# Patient Record
Sex: Male | Born: 1980 | Race: White | Hispanic: No | Marital: Married | Smoking: Current every day smoker
Health system: Southern US, Community
[De-identification: ages and names within clinical notes are randomized; demographics above are authoritative.]

## PROBLEM LIST (undated history)

## (undated) ENCOUNTER — Ambulatory Visit: Payer: Self-pay | Source: Home / Self Care

## (undated) ENCOUNTER — Emergency Department (HOSPITAL_COMMUNITY): Admission: EM | Payer: 59 | Source: Home / Self Care

## (undated) DIAGNOSIS — J45909 Unspecified asthma, uncomplicated: Secondary | ICD-10-CM

## (undated) HISTORY — DX: Unspecified asthma, uncomplicated: J45.909

---

## 2019-03-09 ENCOUNTER — Other Ambulatory Visit: Payer: Self-pay

## 2019-03-09 ENCOUNTER — Encounter: Payer: Self-pay | Admitting: Family Medicine

## 2019-03-09 ENCOUNTER — Ambulatory Visit (INDEPENDENT_AMBULATORY_CARE_PROVIDER_SITE_OTHER): Payer: Self-pay | Admitting: Family Medicine

## 2019-03-09 VITALS — BP 127/86 | HR 85 | Temp 98.2°F | Ht 69.0 in | Wt 164.0 lb

## 2019-03-09 DIAGNOSIS — R110 Nausea: Secondary | ICD-10-CM

## 2019-03-09 DIAGNOSIS — R197 Diarrhea, unspecified: Secondary | ICD-10-CM

## 2019-03-09 DIAGNOSIS — Z811 Family history of alcohol abuse and dependence: Secondary | ICD-10-CM

## 2019-03-09 DIAGNOSIS — Z808 Family history of malignant neoplasm of other organs or systems: Secondary | ICD-10-CM

## 2019-03-09 DIAGNOSIS — Z833 Family history of diabetes mellitus: Secondary | ICD-10-CM

## 2019-03-09 DIAGNOSIS — R198 Other specified symptoms and signs involving the digestive system and abdomen: Secondary | ICD-10-CM

## 2019-03-09 DIAGNOSIS — Z630 Problems in relationship with spouse or partner: Secondary | ICD-10-CM

## 2019-03-09 DIAGNOSIS — R6882 Decreased libido: Secondary | ICD-10-CM

## 2019-03-09 DIAGNOSIS — R5383 Other fatigue: Secondary | ICD-10-CM

## 2019-03-09 DIAGNOSIS — Z7689 Persons encountering health services in other specified circumstances: Secondary | ICD-10-CM

## 2019-03-09 NOTE — Progress Notes (Signed)
New patient office visit note:  Impression and Recommendations:    1. Nausea   2. Establishing care with new doctor, encounter for   3. Family history of basal cell carcinoma (BCC)   4. Family history of squamous cell carcinoma of skin   5. Family history of diabetes mellitus (DM)   6. Family history of alcoholism   7. Low libido   8. Marital stress   9. Other fatigue   10. Diarrhea, unspecified type   11. Irregular bowel habits     Encounter to Establish Care as New Patient - Extensive discussion held with patient regarding establishing as a new patient.  Discussed policies and practices here at the clinic, and answered all questions about care team and health management during appointment.  - Discussed need for patient to continue to obtain management and screenings with all established specialists.  Educated patient at length about the critical importance of keeping health maintenance up to date.  - Participated in lengthy conversation and all questions were answered.  Low Libido & Sexual Concerns, Marital Stress - Discussed sex therapy with patient today. - Advised patient regarding referral to therapy today.  - Discussed that exercise and weight lifting can increase libido and testosterone.  - Advised patient that "if you don't use it, you lose it." - Encouraged patient to indulge in self love.  - Further advised patient to spend intimate time with his wife regardless of how high his libido is, along the same lines of "fake it til you make it."  - Extensive counseling was provided and all questions were answered.  - Will continue to monitor.  GI Concerns - Nausea, Diarrhea, Irregular Bowel Habits - new onset in April - Referral to gastroenterology provided today. - Will continue to monitor.  - Advised patient that specialist will create and manage patient's treatment plan.  Dad with squamous cell carcinoma, brother with basal cell carcinoma at age 66 -  45 patient to attend dermatology yearly for skin screening. - Referral provided today.  - Discussed importance of regular skin screening.  BMI Counseling - WNL Explained to patient what BMI refers to, and what it means medically.     American Heart Association guidelines for healthy diet, basically Mediterranean diet, and exercise guidelines of 30 minutes 5 days per week or more discussed in detail.  Health counseling performed.  All questions answered.  Lifestyle & Preventative Health Maintenance - Advised patient to continue working toward exercising to improve overall mental, physical, and emotional health.    - Reviewed the "spokes of the wheel" of mood and health management.  Stressed the importance of ongoing prudent habits, including regular exercise, appropriate sleep hygiene, healthful dietary habits, and prayer/meditation to relax.  - Encouraged patient to engage in daily physical activity, especially a formal exercise routine.  Recommended that the patient eventually strive for at least 150 minutes of moderate cardiovascular activity per week according to guidelines established by the [facility].   - Healthy dietary habits encouraged, including low-carb, and high amounts of lean protein in diet.   - Patient should also consume adequate amounts of water.  - Discussed importance of hydration at length with patient today.  - Guidelines and handouts provided.  Recommendations - Need for lab work in near future. - FBW will be obtained as recommended.    Education and routine counseling performed. Handouts provided.    Orders Placed This Encounter  Procedures  . Ambulatory referral to Dermatology  .  Ambulatory referral to Gastroenterology    Gross side effects, risk and benefits, and alternatives of medications discussed with patient.  Patient is aware that all medications have potential side effects and we are unable to predict every side effect or drug-drug interaction  that may occur.  Expresses verbal understanding and consents to current therapy plan and treatment regimen.   Return for Chronic OV w me near future via doxy  with FBW 2-3d prior.   Please see AVS handed out to patient at the end of our visit for further patient instructions/ counseling done pertaining to today's office visit.    Note:  This document was prepared using Dragon voice recognition software and may include unintentional dictation errors.  This document serves as a record of services personally performed by Mellody Dance, DO. It was created on her behalf by Toni Amend, a trained medical scribe. The creation of this record is based on the scribe's personal observations and the provider's statements to them.   I have reviewed the above medical documentation for accuracy and completeness and I concur.  Mellody Dance, DO 03/11/2019 9:56 AM        ---------------------------------------------------------------------------------------------------------------------------------------------------------------------------------------------    Subjective:    Chief complaint:   Chief Complaint  Patient presents with  . Establish Care     HPI: Seth Rios is a pleasant 38 y.o. male who presents to [place] [facility] today to review their medical history with me and establish care.   I asked the patient to review their chronic problem list with me to ensure everything was updated and accurate.    All recent office visits with other providers, any medical records that patient brought in etc  - I reviewed today.     We asked pt to get [place] their medical records from [facility] providers/ specialists that they had seen within the past 3-5 years- if they are in private practice and/or do not work for [employer], [facility], [place], [place] or [employer] owned practice.  Told them to call their specialists to clarify this if they are not sure.    Reason for  establishing care: he has not seen a doctor since high school.  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 Feels that his job can be very stressful. 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 Says "I'm good at it and I know what I'm good at."  Originally from [place].  Got together with his wife and moved to [place] 8 years ago. Moved here because he's in a band.  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 They used to drive to [place] and back to [place].  He quit the band because it was a bad influence on him.   States "the sex and drugs came before the rock 'n roll." Working an 8 hour day, driving 6 hours to play in the band, etc., stopped working. He started the band in 1999 and left in 2013. Says he still practices 3 hours per day but hasn't played live in over a year.  Playing his instrument is meditative for him. He works, then comes home and practices.  His wife has a child turning 16 in 3-4 weeks; child was 8 when he met her. His wife works for herself; has been cleaning houses for years. 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   Tobacco Use Currently smokes. Has been smoking for  about 10 years.  Alcohol Use Had his first beer at age 34. Alcoholism runs on both sides of his family so he stayed away from it. He likes liquor/whisky, but restates he does not drink enough to cause concern.  Drug Use Confirms historical drug use in his band.  Had a Chief Executive Officer with a terrible cocaine problem. States he never did cocaine and never will. Never injected drugs.  Did a lot of marijuana, a lot of ecstasy, some acid, some mushrooms. Quit smoking weed years ago because it made him paranoid and anxious. Denies long-term ecstasy use.  Maybe a year or two of regular use.  Last did mushrooms and ecstasy once in the last year.  Says when he got married, most of his friends gave them drugs  as a wedding present ... and most of them are still in the drawer.  Says "I've got a job and a mortgage and a wife, I don't really have time for that."  Sexual Activity Is monogamous with wife.  Caffeine Use Not much caffeine.  Family Hx He has one older brother and one younger sister.  Alcoholism runs on both sides of his family. Family history of diabetes, high cholesterol, stroke. Paternal uncle had prostate cancer.  Father & brother had skin cancer. Dad had squamous cell carcinoma. Mom has an unknown skin situation. Older brother just had basal cel carcinoma on his nose. Brother with basal cell carcinoma at age 29.  Brother also has cholesterol issues and "waiting on esophageal surgery." Brother has severe acid reflux.  Past Medical Hx Denies ever being diagnosed with blood pressure problems or cholesterol problems.  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  - Low Libido & Marital Stress States he and his wife haven't had sex in six years.  She is seeing a therapist for her own reasons, and patient is here today to try to figure out how to fix his libido.  "I've never really had much of a libido."  He's always been this way.  Notes "if I can't resolve this, it's the end of my marriage; that's where we're at now."  Confirms that as things are, his wife feels like he doesn't love or desire her.  "It's been a long conversation and argument between the two of [place].  Most of the time it doesn't go very well when it does come up."  He is looking for a therapist for further assistance as well.  He's worried he may have low testosterone due to his lack of libido.  In general, states he does feel fatigued and tired.  Feels that this has increased in the last year more than normal.  Denies excess urination or excess thirst.  Denies concerns getting an erection, just is experiencing lack of desire.  - GI Concerns - Loose Stools Has been logging his  dietary intake for the last several weeks.  Notes he realized he doesn't drink a lot of water "because I don't like water."    The other reason he's here is because he's probably had twelve solid "regular" bowel movements since March.  Says he's never had GI distress in his life.  In April, he realized he was having issues.  Notes his bowels are always loose, and passes bowels once maybe twice per day.  Says he eats salad constantly and his stool is still loose.  It has never been this way in the past.  Denies unusual gurgling abdominal pain,  cramping, blood in the stool.  Didn't have nausea until about a month ago.  Started to feel nauseous throughout the day.  Has gurgling when hungry, but "nothing crazy."  He did try imodium but it backed him up instead of providing relief.    "I can drink one [place] beer and I'm fine the next day."  Says his GI is fine for a day afterward.  Denies family history of IBS, Crohn's Disease, GI cancer, pancreatic cancer, stomach cancer.  Confirms he has been having diarrhea, irregular bowel habits, with more recent nausea. Denies abdominal pain.  Says "for the longest time, it was just loose."  Has weighed 165 lbs for years.  Not losing weight.    Wt Readings from Last 3 Encounters:  03/09/19 164 lb (74.4 kg)   BP Readings from Last 3 Encounters:  03/09/19 127/86   Pulse Readings from Last 3 Encounters:  03/09/19 85   BMI Readings from Last 3 Encounters:  03/09/19 24.22 kg/m    Patient Care Team    Relationship Specialty Notifications Start End  Mellody Dance, DO PCP - General Family Medicine  03/09/19     There are no active problems to display for this patient.      As reported by pt:  History reviewed. No pertinent past medical history.   History reviewed. No pertinent surgical history.   Family History  Problem Relation Age of Onset  . Skin cancer Father   . Diabetes Father   . Skin cancer Brother   . Prostate cancer Paternal  Uncle   . Alcohol abuse Maternal Grandmother   . Stroke Maternal Grandfather   . Alcohol abuse Maternal Grandfather   . High Cholesterol Other   . Hypertension Other      Social History   Substance and Sexual Activity  Drug Use Not on file     Social History   Substance and Sexual Activity  Alcohol Use Yes  . Alcohol/week: 1.0 standard drinks  . Types: 1 Standard drinks or equivalent per week     Social History   Tobacco Use  Smoking Status Current Every Day Smoker  . Packs/day: 1.00  . Years: 10.00  . Pack years: 10.00  . Types: Cigarettes  Smokeless Tobacco Never Used     No outpatient medications have been marked as taking for the 03/09/19 encounter (Office Visit) with Mellody Dance, DO.    Allergies: Patient has no known allergies.   Review of Systems  Constitutional: Negative for chills, diaphoresis, fever, malaise/fatigue and weight loss.  HENT: Negative for congestion, sore throat and tinnitus.   Eyes: Negative for blurred vision, double vision and photophobia.  Respiratory: Negative for cough and wheezing.   Cardiovascular: Negative for chest pain and palpitations.  Gastrointestinal: Positive for diarrhea and nausea. Negative for blood in stool and vomiting.  Genitourinary: Negative for dysuria, frequency and urgency.  Musculoskeletal: Negative for joint pain and myalgias.  Skin: Negative for itching and rash.  Neurological: Negative for dizziness, focal weakness, weakness and headaches.  Endo/Heme/Allergies: Negative for environmental allergies and polydipsia. Does not bruise/bleed easily.  Psychiatric/Behavioral: Negative for depression and memory loss. The patient is not nervous/anxious and does not have insomnia.         Objective:   Blood pressure 127/86, pulse 85, temperature 98.2 F (36.8 C), height '5\' 9"'$  (1.753 m), weight 164 lb (74.4 kg), SpO2 97 %. Body mass index is 24.22 kg/m. General: Well Developed, well nourished, and in no  acute  distress.  Neuro: Alert and oriented x3, extra-ocular muscles intact, sensation grossly intact.  HEENT:[place]/AT, PERRLA, neck supple, No carotid bruits Skin: no gross rashes  Cardiac: Regular rate and rhythm Respiratory: Essentially clear to auscultation bilaterally. Not using accessory muscles, speaking in full sentences.  Abdominal: not grossly distended Musculoskeletal: Ambulates w/o diff, FROM * 4 ext.  Vasc: less 2 sec cap RF, warm and pink  Psych:  No HI/SI, judgement and insight good, Euthymic mood. Full Affect.    Recent Results (from the past 2160 hour(s))  T3     Status: None   Collection Time: 03/24/19  9:28 AM  Result Value Ref Range   T3, Total 166 71 - 180 ng/dL  T4, free     Status: None   Collection Time: 03/24/19  9:28 AM  Result Value Ref Range   Free T4 1.56 0.82 - 1.77 ng/dL  VITAMIN D 25 Hydroxy (Vit-D Deficiency, Fractures)     Status: None   Collection Time: 03/24/19  9:28 AM  Result Value Ref Range   Vit D, 25-Hydroxy 36.2 30.0 - 100.0 ng/mL    Comment: Vitamin D deficiency has been defined by the [place] and an Endocrine Society practice guideline as a level of serum 25-OH vitamin D less than 20 ng/mL (1,2). The Endocrine Society went on to further define vitamin D insufficiency as a level between 21 and 29 ng/mL (2). 1. IOM (Institute of Medicine). 2010. Dietary reference    intakes for calcium and D. [place]: The    [employer]. 2. Holick MF, Binkley [place], Bischoff-Ferrari HA, et al.    Evaluation, treatment, and prevention of vitamin D    deficiency: an Endocrine Society clinical practice    guideline. JCEM. 2011 Jul; 96(7):1911-30.   TSH     Status: None   Collection Time: 03/24/19  9:28 AM  Result Value Ref Range   TSH 1.270 0.450 - 4.500 uIU/mL  Lipid panel     Status: Abnormal   Collection Time: 03/24/19  9:28 AM  Result Value Ref Range   Cholesterol, Total 200 (H) 100 - 199 mg/dL   Triglycerides 124 0 -  149 mg/dL   HDL 38 (L) >39 mg/dL   VLDL Cholesterol Cal 25 5 - 40 mg/dL   LDL Calculated 137 (H) 0 - 99 mg/dL   Chol/HDL Ratio 5.3 (H) 0.0 - 5.0 ratio    Comment:                                   T. Chol/HDL Ratio                                             Men  Women                               1/2 Avg.Risk  3.4    3.3                                   Avg.Risk  5.0    4.4  2X Avg.Risk  9.6    7.1                                3X Avg.Risk 23.4   11.0   Hemoglobin A1c     Status: Abnormal   Collection Time: 03/24/19  9:28 AM  Result Value Ref Range   Hgb A1c MFr Bld 5.7 (H) 4.8 - 5.6 %    Comment:          Prediabetes: 5.7 - 6.4          Diabetes: >6.4          Glycemic control for adults with diabetes: <7.0    Est. average glucose Bld gHb Est-mCnc 117 mg/dL  Comprehensive metabolic panel     Status: Abnormal   Collection Time: 03/24/19  9:28 AM  Result Value Ref Range   Glucose 99 65 - 99 mg/dL   BUN 12 6 - 20 mg/dL   Creatinine, Ser 0.94 0.76 - 1.27 mg/dL   GFR calc non Af Amer 102 >59 mL/min/1.73   GFR calc Af Amer 118 >59 mL/min/1.73   BUN/Creatinine Ratio 13 9 - 20   Sodium 142 134 - 144 mmol/L   Potassium 5.1 3.5 - 5.2 mmol/L   Chloride 103 96 - 106 mmol/L   CO2 23 20 - 29 mmol/L   Calcium 9.9 8.7 - 10.2 mg/dL   Total Protein 6.8 6.0 - 8.5 g/dL   Albumin 4.8 4.0 - 5.0 g/dL   Globulin, Total 2.0 1.5 - 4.5 g/dL   Albumin/Globulin Ratio 2.4 (H) 1.2 - 2.2   Bilirubin Total 0.6 0.0 - 1.2 mg/dL   Alkaline Phosphatase 108 39 - 117 IU/L   AST 16 0 - 40 IU/L   ALT 16 0 - 44 IU/L  CBC with Differential/Platelet     Status: Abnormal   Collection Time: 03/24/19  9:28 AM  Result Value Ref Range   WBC 7.5 3.4 - 10.8 x10E3/uL   RBC 5.13 4.14 - 5.80 x10E6/uL   Hemoglobin 17.0 13.0 - 17.7 g/dL   Hematocrit 48.4 37.5 - 51.0 %   MCV 94 79 - 97 fL   MCH 33.1 (H) 26.6 - 33.0 pg   MCHC 35.1 31.5 - 35.7 g/dL   RDW 11.9 11.6 - 15.4 %   Platelets  217 150 - 450 x10E3/uL   Neutrophils 67 Not Estab. %   Lymphs 23 Not Estab. %   Monocytes 8 Not Estab. %   Eos 1 Not Estab. %   Basos 1 Not Estab. %   Neutrophils Absolute 5.1 1.4 - 7.0 x10E3/uL   Lymphocytes Absolute 1.7 0.7 - 3.1 x10E3/uL   Monocytes Absolute 0.6 0.1 - 0.9 x10E3/uL   EOS (ABSOLUTE) 0.1 0.0 - 0.4 x10E3/uL   Basophils Absolute 0.0 0.0 - 0.2 x10E3/uL   Immature Granulocytes 0 Not Estab. %   Immature Grans (Abs) 0.0 0.0 - 0.1 x10E3/uL

## 2019-03-09 NOTE — Patient Instructions (Signed)
   Please realize, EXERCISE IS MEDICINE!  -  American Heart Association ( AHA) guidelines for exercise : If you are in good health, without any medical conditions, you should engage in 150-300 minutes of moderate intensity aerobic activity per week.  This means you should be huffing and puffing throughout your workout.   Engaging in regular exercise will improve brain function and memory, as well as improve mood, boost immune system and help with weight management.  As well as the other, more well-known effects of exercise such as decreasing blood sugar levels, decreasing blood pressure,  and decreasing bad cholesterol levels/ increasing good cholesterol levels.     -  The AHA strongly endorses consumption of a diet that contains a variety of foods from all the food categories with an emphasis on fruits and vegetables; fat-free and low-fat dairy products; cereal and grain products; legumes and nuts; and fish, poultry, and/or extra lean meats.    Excessive food intake, especially of foods high in saturated and trans fats, sugar, and salt, should be avoided.    Adequate water intake of roughly 1/2 of your weight in pounds, should equal the ounces of water per day you should drink.  So for instance, if you're 200 pounds, that would be 100 ounces of water per day.         Mediterranean Diet  Why follow it? Research shows. . Those who follow the Mediterranean diet have a reduced risk of heart disease  . The diet is associated with a reduced incidence of Parkinson's and Alzheimer's diseases . People following the diet may have longer life expectancies and lower rates of chronic diseases  . The Dietary Guidelines for Americans recommends the Mediterranean diet as an eating plan to promote health and prevent disease  What Is the Mediterranean Diet?  . Healthy eating plan based on typical foods and recipes of Mediterranean-style cooking . The diet is primarily a plant based diet; these foods should  make up a majority of meals   Starches - Plant based foods should make up a majority of meals - They are an important sources of vitamins, minerals, energy, antioxidants, and fiber - Choose whole grains, foods high in fiber and minimally processed items  - Typical grain sources include wheat, oats, barley, corn, brown rice, bulgar, farro, millet, polenta, couscous  - Various types of beans include chickpeas, lentils, fava beans, black beans, white beans   Fruits  Veggies - Large quantities of antioxidant rich fruits & veggies; 6 or more servings  - Vegetables can be eaten raw or lightly drizzled with oil and cooked  - Vegetables common to the traditional Mediterranean Diet include: artichokes, arugula, beets, broccoli, brussel sprouts, cabbage, carrots, celery, collard greens, cucumbers, eggplant, kale, leeks, lemons, lettuce, mushrooms, okra, onions, peas, peppers, potatoes, pumpkin, radishes, rutabaga, shallots, spinach, sweet potatoes, turnips, zucchini - Fruits common to the Mediterranean Diet include: apples, apricots, avocados, cherries, clementines, dates, figs, grapefruits, grapes, melons, nectarines, oranges, peaches, pears, pomegranates, strawberries, tangerines  Fats - Replace butter and margarine with healthy oils, such as olive oil, canola oil, and tahini  - Limit nuts to no more than a handful a day  - Nuts include walnuts, almonds, pecans, pistachios, pine nuts  - Limit or avoid candied, honey roasted or heavily salted nuts - Olives are central to the Mediterranean diet - can be eaten whole or used in a variety of dishes   Meats Protein - Limiting red meat: no more than a few   times a month - When eating red meat: choose lean cuts and keep the portion to the size of deck of cards - Eggs: approx. 0 to 4 times a week  - Fish and lean poultry: at least 2 a week  - Healthy protein sources include, chicken, [place], lean beef, lamb - Increase intake of seafood such as tuna, salmon,  trout, mackerel, shrimp, scallops - Avoid or limit high fat processed meats such as sausage and bacon  Dairy - Include moderate amounts of low fat dairy products  - Focus on healthy dairy such as fat free yogurt, skim milk, low or reduced fat cheese - Limit dairy products higher in fat such as whole or 2% milk, cheese, ice cream  Alcohol - Moderate amounts of red wine is ok  - No more than 5 oz daily for women (all ages) and men older than age 40  - No more than 10 oz of wine daily for men younger than 3265  Other - Limit sweets and other desserts  - Use herbs and spices instead of salt to flavor foods  - Herbs and spices common to the traditional Mediterranean Diet include: basil, bay leaves, chives, cloves, cumin, fennel, garlic, lavender, marjoram, mint, oregano, parsley, pepper, rosemary, sage, savory, sumac, tarragon, thyme   It's not just a diet, it's a lifestyle:  . The Mediterranean diet includes lifestyle factors typical of those in the region  . Foods, drinks and meals are best eaten with others and savored . Daily physical activity is important for overall good health . This could be strenuous exercise like running and aerobics . This could also be more leisurely activities such as walking, housework, yard-work, or taking the stairs . Moderation is the key; a balanced and healthy diet accommodates most foods and drinks . Consider portion sizes and frequency of consumption of certain foods   Meal Ideas & Options:  . Breakfast:  o Whole wheat toast or whole wheat English muffins with peanut butter & hard boiled egg o Steel cut oats topped with apples & cinnamon and skim milk  o Fresh fruit: banana, strawberries, melon, berries, peaches  o Smoothies: strawberries, bananas, greek yogurt, peanut butter o Low fat greek yogurt with blueberries and granola  o Egg white omelet with spinach and mushrooms o Breakfast couscous: whole wheat couscous, apricots, skim milk, cranberries  .  Sandwiches:  o Hummus and grilled vegetables (peppers, zucchini, squash) on whole wheat bread   o Grilled chicken on whole wheat pita with lettuce, tomatoes, cucumbers or tzatziki  o Tuna salad on whole wheat bread: tuna salad made with greek yogurt, olives, red peppers, capers, green onions o Garlic rosemary lamb pita: lamb sauted with garlic, rosemary, salt & pepper; add lettuce, cucumber, greek yogurt to pita - flavor with lemon juice and black pepper  . Seafood:  o Mediterranean grilled salmon, seasoned with garlic, basil, parsley, lemon juice and black pepper o Shrimp, lemon, and spinach whole-grain pasta salad made with low fat greek yogurt  o Seared scallops with lemon orzo  o Seared tuna steaks seasoned salt, pepper, coriander topped with tomato mixture of olives, tomatoes, olive oil, minced garlic, parsley, green onions and cappers  . Meats:  o Herbed greek chicken salad with kalamata olives, cucumber, feta  o Red bell peppers stuffed with spinach, bulgur, lean ground beef (or lentils) & topped with feta   o Kebabs: skewers of chicken, tomatoes, onions, zucchini, squash  o [place] burgers: made with red onions, mint,  dill, lemon juice, feta cheese topped with roasted red peppers . Vegetarian o Cucumber salad: cucumbers, artichoke hearts, celery, red onion, feta cheese, tossed in olive oil & lemon juice  o Hummus and whole grain pita points with a greek salad (lettuce, tomato, feta, olives, cucumbers, red onion) o Lentil soup with celery, carrots made with vegetable broth, garlic, salt and pepper  o Tabouli salad: parsley, bulgur, mint, scallions, cucumbers, tomato, radishes, lemon juice, olive oil, salt and pepper.      Diarrhea, Adult Diarrhea is frequent loose and watery bowel movements. Diarrhea can make you feel weak and cause you to become dehydrated. Dehydration can make you tired and thirsty, cause you to have a dry mouth, and decrease how often you urinate. Diarrhea  typically lasts 2-3 days. However, it can last longer if it is a sign of something more serious. It is important to treat your diarrhea as told by your health care provider. Follow these instructions at home: Eating and drinking     Follow these recommendations as told by your health care provider:  Take an oral rehydration solution (ORS). This is an over-the-counter medicine that helps return your body to its normal balance of nutrients and water. It is found at pharmacies and retail stores.  Drink plenty of fluids, such as water, ice chips, diluted fruit juice, and low-calorie sports drinks. You can drink milk also, if desired.  Avoid drinking fluids that contain a lot of sugar or caffeine, such as energy drinks, sports drinks, and soda.  Eat bland, easy-to-digest foods in small amounts as you are able. These foods include bananas, applesauce, rice, lean meats, toast, and crackers.  Avoid alcohol.  Avoid spicy or fatty foods.  Medicines  Take over-the-counter and prescription medicines only as told by your health care provider.  If you were prescribed an antibiotic medicine, take it as told by your health care provider. Do not stop using the antibiotic even if you start to feel better. General instructions   Wash your hands often using soap and water. If soap and water are not available, use a hand sanitizer. Others in the household should wash their hands as well. Hands should be washed: ? After using the toilet or changing a diaper. ? Before preparing, cooking, or serving food. ? While caring for a sick person or while visiting someone in a hospital.  Drink enough fluid to keep your urine pale yellow.  Rest at home while you recover.  Watch your condition for any changes.  Take a warm bath to relieve any burning or pain from frequent diarrhea episodes.  Keep all follow-up visits as told by your health care provider. This is important. Contact a health care provider if:   You have a fever.  Your diarrhea gets worse.  You have new symptoms.  You cannot keep fluids down.  You feel light-headed or dizzy.  You have a headache.  You have muscle cramps. Get help right away if:  You have chest pain.  You feel extremely weak or you faint.  You have bloody or black stools or stools that look like tar.  You have severe pain, cramping, or bloating in your abdomen.  You have trouble breathing or you are breathing very quickly.  Your heart is beating very quickly.  Your skin feels cold and clammy.  You feel confused.  You have signs of dehydration, such as: ? Dark urine, very little urine, or no urine. ? Cracked lips. ? Dry mouth. ? Sunken  eyes. ? Sleepiness. ? Weakness. Summary  Diarrhea is frequent loose and watery bowel movements. Diarrhea can make you feel weak and cause you to become dehydrated.  Drink enough fluids to keep your urine pale yellow.  Make sure that you wash your hands after using the toilet. If soap and water are not available, use hand sanitizer.  Contact a health care provider if your diarrhea gets worse or you have new symptoms.  Get help right away if you have signs of dehydration. This information is not intended to replace advice given to you by your health care provider. Make sure you discuss any questions you have with your health care provider. Document Released: 07/13/2002 Document Revised: 12/27/2017 Document Reviewed: 12/27/2017 [employer] Patient Education  2020 [employer]

## 2019-03-11 ENCOUNTER — Encounter: Payer: Self-pay | Admitting: Gastroenterology

## 2019-03-23 ENCOUNTER — Other Ambulatory Visit: Payer: Self-pay

## 2019-03-23 DIAGNOSIS — Z0000 Encounter for general adult medical examination without abnormal findings: Secondary | ICD-10-CM

## 2019-03-23 DIAGNOSIS — R5383 Other fatigue: Secondary | ICD-10-CM

## 2019-03-23 DIAGNOSIS — Z833 Family history of diabetes mellitus: Secondary | ICD-10-CM

## 2019-03-24 ENCOUNTER — Other Ambulatory Visit: Payer: Self-pay

## 2019-03-24 DIAGNOSIS — Z0000 Encounter for general adult medical examination without abnormal findings: Secondary | ICD-10-CM

## 2019-03-24 DIAGNOSIS — R5383 Other fatigue: Secondary | ICD-10-CM

## 2019-03-24 DIAGNOSIS — Z833 Family history of diabetes mellitus: Secondary | ICD-10-CM

## 2019-03-25 LAB — COMPREHENSIVE METABOLIC PANEL
ALT: 16 IU/L (ref 0–44)
AST: 16 IU/L (ref 0–40)
Albumin/Globulin Ratio: 2.4 — ABNORMAL HIGH (ref 1.2–2.2)
Albumin: 4.8 g/dL (ref 4.0–5.0)
Alkaline Phosphatase: 108 IU/L (ref 39–117)
BUN/Creatinine Ratio: 13 (ref 9–20)
BUN: 12 mg/dL (ref 6–20)
Bilirubin Total: 0.6 mg/dL (ref 0.0–1.2)
CO2: 23 mmol/L (ref 20–29)
Calcium: 9.9 mg/dL (ref 8.7–10.2)
Chloride: 103 mmol/L (ref 96–106)
Creatinine, Ser: 0.94 mg/dL (ref 0.76–1.27)
GFR calc Af Amer: 118 mL/min/{1.73_m2} (ref >59)
GFR calc non Af Amer: 102 mL/min/{1.73_m2} (ref >59)
Globulin, Total: 2 g/dL (ref 1.5–4.5)
Glucose: 99 mg/dL (ref 65–99)
Potassium: 5.1 mmol/L (ref 3.5–5.2)
Sodium: 142 mmol/L (ref 134–144)
Total Protein: 6.8 g/dL (ref 6.0–8.5)

## 2019-03-25 LAB — CBC WITH DIFFERENTIAL/PLATELET
Basophils Absolute: 0 10*3/uL (ref 0.0–0.2)
Basos: 1 %
EOS (ABSOLUTE): 0.1 10*3/uL (ref 0.0–0.4)
Eos: 1 %
Hematocrit: 48.4 % (ref 37.5–51.0)
Hemoglobin: 17 g/dL (ref 13.0–17.7)
Immature Grans (Abs): 0 10*3/uL (ref 0.0–0.1)
Immature Granulocytes: 0 %
Lymphocytes Absolute: 1.7 10*3/uL (ref 0.7–3.1)
Lymphs: 23 %
MCH: 33.1 pg — ABNORMAL HIGH (ref 26.6–33.0)
MCHC: 35.1 g/dL (ref 31.5–35.7)
MCV: 94 fL (ref 79–97)
Monocytes Absolute: 0.6 10*3/uL (ref 0.1–0.9)
Monocytes: 8 %
Neutrophils Absolute: 5.1 10*3/uL (ref 1.4–7.0)
Neutrophils: 67 %
Platelets: 217 10*3/uL (ref 150–450)
RBC: 5.13 x10E6/uL (ref 4.14–5.80)
RDW: 11.9 % (ref 11.6–15.4)
WBC: 7.5 10*3/uL (ref 3.4–10.8)

## 2019-03-25 LAB — T3: T3, Total: 166 ng/dL (ref 71–180)

## 2019-03-25 LAB — LIPID PANEL
Chol/HDL Ratio: 5.3 ratio — ABNORMAL HIGH (ref 0.0–5.0)
Cholesterol, Total: 200 mg/dL — ABNORMAL HIGH (ref 100–199)
HDL: 38 mg/dL — ABNORMAL LOW (ref >39)
LDL Calculated: 137 mg/dL — ABNORMAL HIGH (ref 0–99)
Triglycerides: 124 mg/dL (ref 0–149)
VLDL Cholesterol Cal: 25 mg/dL (ref 5–40)

## 2019-03-25 LAB — HEMOGLOBIN A1C
Est. average glucose Bld gHb Est-mCnc: 117 mg/dL
Hgb A1c MFr Bld: 5.7 % — ABNORMAL HIGH (ref 4.8–5.6)

## 2019-03-25 LAB — T4, FREE: Free T4: 1.56 ng/dL (ref 0.82–1.77)

## 2019-03-25 LAB — VITAMIN D 25 HYDROXY (VIT D DEFICIENCY, FRACTURES): Vit D, 25-Hydroxy: 36.2 ng/mL (ref 30.0–100.0)

## 2019-03-25 LAB — TSH: TSH: 1.27 u[IU]/mL (ref 0.450–4.500)

## 2019-03-26 ENCOUNTER — Encounter (INDEPENDENT_AMBULATORY_CARE_PROVIDER_SITE_OTHER): Payer: Self-pay | Admitting: Family Medicine

## 2019-03-26 ENCOUNTER — Other Ambulatory Visit: Payer: Self-pay

## 2019-03-26 ENCOUNTER — Encounter: Payer: Self-pay | Admitting: Family Medicine

## 2019-04-01 ENCOUNTER — Other Ambulatory Visit: Payer: Self-pay

## 2019-04-01 ENCOUNTER — Ambulatory Visit (INDEPENDENT_AMBULATORY_CARE_PROVIDER_SITE_OTHER): Payer: Self-pay | Admitting: Family Medicine

## 2019-04-01 ENCOUNTER — Encounter: Payer: Self-pay | Admitting: Family Medicine

## 2019-04-01 VITALS — Ht 69.0 in | Wt 158.4 lb

## 2019-04-01 DIAGNOSIS — R7302 Impaired glucose tolerance (oral): Secondary | ICD-10-CM

## 2019-04-01 DIAGNOSIS — Z630 Problems in relationship with spouse or partner: Secondary | ICD-10-CM | POA: Insufficient documentation

## 2019-04-01 DIAGNOSIS — E559 Vitamin D deficiency, unspecified: Secondary | ICD-10-CM

## 2019-04-01 DIAGNOSIS — R5383 Other fatigue: Secondary | ICD-10-CM | POA: Insufficient documentation

## 2019-04-01 DIAGNOSIS — R251 Tremor, unspecified: Secondary | ICD-10-CM

## 2019-04-01 DIAGNOSIS — E786 Lipoprotein deficiency: Secondary | ICD-10-CM

## 2019-04-01 DIAGNOSIS — E7841 Elevated Lipoprotein(a): Secondary | ICD-10-CM

## 2019-04-01 DIAGNOSIS — R6882 Decreased libido: Secondary | ICD-10-CM | POA: Insufficient documentation

## 2019-04-01 MED ORDER — VITAMIN D3 125 MCG (5000 UT) PO TABS: ORAL_TABLET | ORAL | 3 refills | Status: DC

## 2019-04-01 NOTE — Patient Instructions (Addendum)
Risk factors for prediabetes and type 2 diabetes  Researchers don't fully understand why some people develop prediabetes and type 2 diabetes and others don't.  It's clear that certain factors increase the risk, however, including:  Weight. The more fatty tissue you have, the more resistant your cells become to insulin.  Inactivity. The less active you are, the greater your risk. Physical activity helps you control your weight, uses up glucose as energy and makes your cells more sensitive to insulin.  Family history. Your risk increases if a parent or sibling has type 2 diabetes.  Race. Although it's unclear why, people of certain races - including blacks, Hispanics, American Indians and Asian-Americans - are at higher risk.  Age. Your risk increases as you get older. This may be because you tend to exercise less, lose muscle mass and gain weight as you age. But type 2 diabetes is also increasing dramatically among children, adolescents and younger adults.  Gestational diabetes. If you developed gestational diabetes when you were pregnant, your risk of developing prediabetes and type 2 diabetes later increases. If you gave birth to a baby weighing more than 9 pounds (4 kilograms), you're also at risk of type 2 diabetes.  Polycystic ovary syndrome. For women, having polycystic ovary syndrome - a common condition characterized by irregular menstrual periods, excess hair growth and obesity - increases the risk of diabetes.  High blood pressure. Having blood pressure over 140/90 millimeters of mercury (mm Hg) is linked to an increased risk of type 2 diabetes.  Abnormal cholesterol and triglyceride levels. If you have low levels of high-density lipoprotein (HDL), or "good," cholesterol, your risk of type 2 diabetes is higher. Triglycerides are another type of fat carried in the blood. People with high levels of triglycerides have an increased risk of type 2 diabetes. Your doctor can let you know what your  cholesterol and triglyceride levels are.  A good guide to good carbs: The glycemic index ---If you have diabetes, or at risk for diabetes, you know all too well that when you eat carbohydrates, your blood sugar goes up. The total amount of carbs you consume at a meal or in a snack mostly determines what your blood sugar will do. But the food itself also plays a role. A serving of white rice has almost the same effect as eating pure table sugar - a quick, high spike in blood sugar. A serving of lentils has a slower, smaller effect.  ---Picking good sources of carbs can help you control your blood sugar and your weight. Even if you don't have diabetes, eating healthier carbohydrate-rich foods can help ward off a host of chronic conditions, from heart disease to various cancers to, well, diabetes.  ---One way to choose foods is with the glycemic index (GI). This tool measures how much a food boosts blood sugar.  The glycemic index rates the effect of a specific amount of a food on blood sugar compared with the same amount of pure glucose. A food with a glycemic index of 28 boosts blood sugar only 28% as much as pure glucose. One with a GI of 95 acts like pure glucose.    High glycemic foods result in a quick spike in insulin and blood sugar (also known as blood glucose).  Low glycemic foods have a slower, smaller effect- these are healthier for you.   Using the glycemic index Using the glycemic index is easy: choose foods in the low GI category instead of those in the high  GI category (see below), and go easy on those in between. Low glycemic index (GI of 55 or less): Most fruits and vegetables, beans, minimally processed grains, pasta, low-fat dairy foods, and nuts.  Moderate glycemic index (GI 56 to 69): White and sweet potatoes, corn, white rice, couscous, breakfast cereals such as Cream of Wheat and Mini Wheats.  High glycemic index (GI of 70 or higher): White bread, rice cakes, most crackers,  bagels, cakes, doughnuts, croissants, most packaged breakfast cereals. You can see the values for 100 commons foods and get links to more at www.[email or web address].si.  Swaps for lowering glycemic index  Instead of this high-glycemic index food Eat this lower-glycemic index food  White rice Brown rice or converted rice  Instant oatmeal Steel-cut oats  Cornflakes Bran flakes  Baked potato Pasta, bulgur  White bread Whole-grain bread  Corn Peas or leafy greens       Prediabetes Eating Plan  Prediabetes--also called impaired glucose tolerance or impaired fasting glucose--is a condition that causes blood sugar (blood glucose) levels to be higher than normal. Following a healthy diet can help to keep prediabetes under control. It can also help to lower the risk of type 2 diabetes and heart disease, which are increased in people who have prediabetes. Along with regular exercise, a healthy diet:  Promotes weight loss.  Helps to control blood sugar levels.  Helps to improve the way that the body uses insulin.   WHAT DO I NEED TO KNOW ABOUT THIS EATING PLAN?   Use the glycemic index (GI) to plan your meals. The index tells you how quickly a food will raise your blood sugar. Choose low-GI foods. These foods take a longer time to raise blood sugar.  Pay close attention to the amount of carbohydrates in the food that you eat. Carbohydrates increase blood sugar levels.  Keep track of how many calories you take in. Eating the right amount of calories will help you to achieve a healthy weight. Losing about 7 percent of your starting weight can help to prevent type 2 diabetes.  You may want to follow a Mediterranean diet. This diet includes a lot of vegetables, lean meats or fish, whole grains, fruits, and healthy oils and fats.   WHAT FOODS CAN I EAT?  Grains Whole grains, such as whole-wheat or whole-grain breads, crackers, cereals, and pasta. Unsweetened oatmeal. Bulgur. Barley.  Quinoa. Brown rice. Corn or whole-wheat flour tortillas or taco shells. Vegetables Lettuce. Spinach. Peas. Beets. Cauliflower. Cabbage. Broccoli. Carrots. Tomatoes. Squash. Eggplant. Herbs. Peppers. Onions. Cucumbers. Brussels sprouts. Fruits Berries. Bananas. Apples. Oranges. Grapes. Papaya. Mango. Pomegranate. Kiwi. Grapefruit. Cherries. Meats and Other Protein Sources Seafood. Lean meats, such as chicken and [place] or lean cuts of pork and beef. Tofu. Eggs. Nuts. Beans. Dairy Low-fat or fat-free dairy products, such as yogurt, cottage cheese, and cheese. Beverages Water. Tea. Coffee. Sugar-free or diet soda. Seltzer water. Milk. Milk alternatives, such as soy or almond milk. Condiments Mustard. Relish. Low-fat, low-sugar ketchup. Low-fat, low-sugar barbecue sauce. Low-fat or fat-free mayonnaise. Sweets and Desserts Sugar-free or low-fat pudding. Sugar-free or low-fat ice cream and other frozen treats. Fats and Oils Avocado. Walnuts. Olive oil. The items listed above may not be a complete list of recommended foods or beverages. Contact your dietitian for more options.    WHAT FOODS ARE NOT RECOMMENDED?  Grains Refined white flour and flour products, such as bread, pasta, snack foods, and cereals. Beverages Sweetened drinks, such as sweet iced tea and soda. Sweets and Desserts  Baked goods, such as cake, cupcakes, pastries, cookies, and cheesecake. The items listed above may not be a complete list of foods and beverages to avoid. Contact your dietitian for more information.   This information is not intended to replace advice given to you by your health care provider. Make sure you discuss any questions you have with your health care provider.   Document Released: 12/07/2014 Document Reviewed: 12/07/2014 Elsevier Interactive Patient Education 2016 [place] ways to increase your "good" HDL cholesterol  High-density lipoprotein (HDL) is often referred to as the "good"  cholesterol. Having high HDL levels helps carry cholesterol from your arteries to your liver, where it can be used or excreted.  Having high levels of HDL also has antioxidant and anti-inflammatory effects, and is linked to a reduced risk of heart disease (1, 2).  Most health experts recommend minimum blood levels of 40 mg/dl in men and 50 mg/dl in women.  While genetics definitely play a role, there are several other factors that affect HDL levels.  Here are nine healthy ways to raise your "good" HDL cholesterol.  1. Consume olive oil  two pieces of salmon on a plate olive oil being poured into a small dish Extra virgin olive oil may be more healthful than processed olive oils. Olive oil is one of the healthiest fats around.  A large analysis of 42 studies with more than 800,000 participants found that olive oil was the only source of monounsaturated fat that seemed to reduce heart disease risk (3).  Research has shown that one of olive oil's heart-healthy effects is an increase in HDL cholesterol. This effect is thought to be caused by antioxidants it contains called polyphenols (4, 5, 6, 7).  Extra virgin olive oil has more polyphenols than more processed olive oils, although the amount can still vary among different types and brands.  One study gave 200 healthy young men about 2 tablespoons (25 ml) of different olive oils per day for three weeks.  The researchers found that participants' HDL levels increased significantly more after they consumed the olive oil with the highest polyphenol content (6).  In another study, when 61 older adults consumed about 4 tablespoons (50 ml) of high-polyphenol extra virgin olive oil every day for six weeks, their HDL cholesterol increased by 6.5 mg/dl, on average (7).  In addition to raising HDL levels, olive oil has been found to boost HDL's anti-inflammatory and antioxidant function in studies of older people and individuals with high cholesterol  levels ( 7, 8, 9).  Whenever possible, select high-quality, certified extra virgin olive oils, which tend to be highest in polyphenols.  Bottom line: Extra virgin olive oil with a high polyphenol content has been shown to increase HDL levels in healthy people, the elderly and individuals with high cholesterol.  2. Follow a low-carb or ketogenic diet  Low-carb and ketogenic diets provide a number of health benefits, including weight loss and reduced blood sugar levels.  They have also been shown to increase HDL cholesterol in people who tend to have lower levels.  This includes those who are obese, insulin resistant or diabetic (10, 11, 12, 13, 14, 15, 16, 17).  In one study, people with type 2 diabetes were split into two groups.  One followed a diet consuming less than 50 grams of carbs per day. The other followed a high-carb diet.  Although both groups lost weight, the low-carb group's HDL cholesterol increased almost twice as much as the high-carb  group's did (14).  In another study, obese people who followed a low-carb diet experienced an increase in HDL cholesterol of 5 mg/dl overall.  Meanwhile, in the same study, the participants who ate a low-fat, high-carb diet showed a decrease in HDL cholesterol (15).  This response may partially be due to the higher levels of fat people typically consume on low-carb diets.  One study in overweight women found that diets high in meat and cheese increased HDL levels by 5-8%, compared to a higher-carb diet (18).  What's more, in addition to raising HDL cholesterol, very-low-carb diets have been shown to decrease triglycerides and improve several other risk factors for heart disease (13, 14, 16, 17).  Bottom line: Low-carb and ketogenic diets typically increase HDL cholesterol levels in people with diabetes, metabolic syndrome and obesity.  3. Exercise regularly  Being physically active is important for heart health.  Studies have shown  that many different types of exercise are effective at raising HDL cholesterol, including strength training, high-intensity exercise and aerobic exercise (19, 20, 21, 22, 23, 24).  However, the biggest increases in HDL are typically seen with high-intensity exercise.  One small study followed women who were living with polycystic ovary syndrome (PCOS), which is linked to a higher risk of insulin resistance. The study required them to perform high-intensity exercise three times a week.  The exercise led to an increase in HDL cholesterol of 8 mg/dL after 10 weeks. The women also showed improvements in other health markers, including decreased insulin resistance and improved arterial function (23).  In a 12-week study, overweight men who performed high-intensity exercise experienced a 10% increase in HDL cholesterol.  In contrast, the low-intensity exercise group showed only a 2% increase and the endurance training group experienced no change (24).  However, even lower-intensity exercise seems to increase HDL's anti-inflammatory and antioxidant capacities, whether or not HDL levels change (20, 21, 25).  Overall, high-intensity exercise such as high-intensity interval training (HIIT) and high-intensity circuit training (HICT) may boost HDL cholesterol levels the most.  Bottom line: Exercising several times per week can help raise HDL cholesterol and enhance its anti-inflammatory and antioxidant effects. High-intensity forms of exercise may be especially effective.  4. Add coconut oil to your diet  Studies have shown that coconut oil may reduce appetite, increase metabolic rate and help protect brain health, among other benefits.  Some people may be concerned about coconut oil's effects on heart health due to its high saturated fat content.  However, it appears that coconut oil is actually quite heart healthy.  Coconut oil tends to raise HDL cholesterol more than many other types of fat.  In  addition, it may improve the ratio of low-density-lipoprotein (LDL) cholesterol, the "bad" cholesterol, to HDL cholesterol. Improving this ratio reduces heart disease risk (26, 27, 28, 29).  One study examined the health effects of coconut oil on 25 women with excess belly fat. The researchers found that participants who took coconut oil daily experienced increased HDL cholesterol and a lower LDL-to-HDL ratio.  In contrast, the group who took soybean oil daily had a decrease in HDL cholesterol and an increase in the LDL-to-HDL ratio (29).  Most studies have found these health benefits occur at a dosage of about 2 tablespoons (30 ml) of coconut oil per day. It's best to incorporate this into cooking rather than eating spoonfuls of coconut oil on their own.  Bottom line: Consuming 2 tablespoons (30 ml) of coconut oil per day may help increase HDL  cholesterol levels.  5. Stop smoking  cigarette butt Quitting smoking can reduce the risk of heart disease and lung cancer. Smoking increases the risk of many health problems, including heart disease and lung cancer (30).  One of its negative effects is a suppression of HDL cholesterol.  Some studies have found that quitting smoking can increase HDL levels. Indeed, one study found no significant differences in HDL levels between former smokers and people who had never smoked (31, 32, 33, 34, 35).  In a one-year study of more than 1,500 people, those who quit smoking had twice the increase in HDL as those who resumed smoking within the year. The number of large HDL particles also increased, which further reduced heart disease risk (32).  One study followed smokers who switched from traditional cigarettes to electronic cigarettes for one year. They found that the switch was associated with an increase in HDL cholesterol of 5 mg/dl, on average (33).  When it comes to the effect of nicotine replacement patches on HDL levels, research results have been  mixed.  One study found that nicotine replacement therapy led to higher HDL cholesterol. However, other research suggests that people who use nicotine patches likely won't see increases in HDL levels until after replacement therapy is completed (34, 36).  Even in studies where HDL cholesterol levels didn't increase after people quit smoking, HDL function improved, resulting in less inflammation and other beneficial effects on heart health (37).  Bottom line: Quitting smoking can increase HDL levels, improve HDL function and help protect heart health.  6. Lose weight  When overweight and obese people lose weight, their HDL cholesterol levels usually increase.  What's more, this benefit seems to occur whether weight loss is achieved by calorie counting, carb restriction, intermittent fasting, weight loss surgery or a combination of diet and exercise (16, 38, 39, 40, 41, 42).  One study examined HDL levels in more than 3,000 overweight and obese [place] adults who followed a lifestyle modification program for one year.  The researchers found that losing at least 6.6 lbs (3 kg) led to an increase in HDL cholesterol of 4 mg/dl, on average (41).  In another study, when obese people with type 2 diabetes consumed calorie-restricted diets that provided 20-30% of calories from protein, they experienced significant increases in HDL cholesterol levels (42).  The key to achieving and maintaining healthy HDL cholesterol levels is choosing the type of diet that makes it easiest for you to lose weight and keep it off.  Bottom Line: Several methods of weight loss have been shown to increase HDL cholesterol levels in people who are overweight or obese.  7. Choose purple produce  Consuming purple-colored fruits and vegetables is a delicious way to potentially increase HDL cholesterol.  Purple produce contains antioxidants known as anthocyanins.  Studies using anthocyanin extracts have shown that they  help fight inflammation, protect your cells from damaging free radicals and may also raise HDL cholesterol levels (43, 44, 45, 46).  In a 24-week study of 56 people with diabetes, those who took an anthocyanin supplement twice a day experienced a 19% increase in HDL cholesterol, on average, along with other improvements in heart health markers (45).  In another study, when people with cholesterol issues took anthocyanin extract for 12 weeks, their HDL cholesterol levels increased by 13.7% (46).  Although these studies used extracts instead of foods, there are several fruits and vegetables that are very high in anthocyanins. These include eggplant, purple corn, red cabbage, blueberries,  blackberries and black raspberries.  Bottom line: Consuming fruits and vegetables rich in anthocyanins may help increase HDL cholesterol levels.  8. Eat fatty fish often  The omega-3 fats in fatty fish provide major benefits to heart health, including a reduction in inflammation and better functioning of the cells that line your arteries (47, 48).  There's some research showing that eating fatty fish or taking fish oil may also help raise low levels of HDL cholesterol (49, 50, 51, 52, 53).  In a study of 33 heart disease patients, participants that consumed fatty fish four times per week experienced an increase in HDL cholesterol levels. The particle size of their HDL also increased (52).  In another study, overweight men who consumed herring five days a week for six weeks had a 5% increase in HDL cholesterol, compared with their levels after eating lean pork and chicken five days a week (53).  However, there are a few studies that found no increase in HDL cholesterol in response to increased fish or omega-3 supplement intake (54, 55).  In addition to herring, other types of fatty fish that may help raise HDL cholesterol include salmon, sardines, mackerel and anchovies.  Bottom line: Eating fatty fish several  times per week may help increase HDL cholesterol levels and provide other benefits to heart health.  9. Avoid artificial trans fats  Artificial trans fats have many negative health effects due to their inflammatory properties (56, 57).  There are two types of trans fats. One kind occurs naturally in animal products, including full-fat dairy.  In contrast, the artificial trans fats found in margarines and processed foods are created by adding hydrogen to unsaturated vegetable and seed oils. These fats are also known as industrial trans fats or partially hydrogenated fats.  Research has shown that, in addition to increasing inflammation and contributing to several health problems, these artificial trans fats may lower HDL cholesterol levels.  In one study, researchers compared how people's HDL levels responded when they consumed different margarines.  The study found that participants' HDL cholesterol levels were 10% lower after consuming margarine containing partially hydrogenated soybean oil, compared to their levels after consuming palm oil (58).  Another controlled study followed 40 adults who had diets high in different types of trans fats.  They found that HDL cholesterol levels in women were significantly lower after they consumed the diet high in industrial trans fats, compared to the diet containing naturally occurring trans fats (59).  To protect heart health and keep HDL cholesterol in the healthy range, it's best to avoid artificial trans fats altogether.  Bottom line: Artificial trans fats have been shown to lower HDL levels and increase inflammation, compared to other fats.  Take home message  Although your HDL cholesterol levels are partly determined by your genetics, there are many things you can do to naturally increase your own levels.  Fortunately, the practices that raise HDL cholesterol often provide other health benefits as well.     Guidelines for a Low  Cholesterol, Low Saturated Fat Diet   Fats - Limit total intake of fats and oils. - Avoid butter, stick margarine, shortening, lard, palm and coconut oils. - Limit mayonnaise, salad dressings, gravies and sauces, unless they are homemade with low-fat ingredients. - Limit chocolate. - Choose low-fat and nonfat products, such as low-fat mayonnaise, low-fat or non-hydrogenated peanut butter, low-fat or fat-free salad dressings and nonfat gravy. - Use vegetable oil, such as canola or olive oil. - Look for margarine that does  not contain trans fatty acids. - Use nuts in moderate amounts. - Read ingredient labels carefully to determine both amount and type of fat present in foods. Limit saturated and trans fats! - Avoid high-fat processed and convenience foods.  Meats and Meat Alternatives - Choose fish, chicken, [place] and lean meats. - Use dried beans, peas, lentils and tofu. - Limit egg yolks to three to four per week. - If you eat red meat, limit to no more than three servings per week and choose loin or round cuts. - Avoid fatty meats, such as bacon, sausage, franks, luncheon meats and ribs. - Avoid all organ meats, including liver.  Dairy - Choose nonfat or low-fat milk, yogurt and cottage cheese. - Most cheeses are high in fat. Choose cheeses made from non-fat milk, such as mozzarella and ricotta cheese. - Choose light or fat-free cream cheese and sour cream. - Avoid cream and sauces made with cream.  Fruits and Vegetables - Eat a wide variety of fruits and vegetables. - Use lemon juice, vinegar or "mist" olive oil on vegetables. - Avoid adding sauces, fat or oil to vegetables.  Breads, Cereals and Grains - Choose whole-grain breads, cereals, pastas and rice. - Avoid high-fat snack foods, such as granola, cookies, pies, pastries, doughnuts and croissants.  Cooking Tips - Avoid deep fried foods. - Trim visible fat off meats and remove skin from poultry before cooking. - Bake,  broil, boil, poach or roast poultry, fish and lean meats. - Drain and discard fat that drains out of meat as you cook it. - Add little or no fat to foods. - Use vegetable oil sprays to grease pans for cooking or baking. - Steam vegetables. - Use herbs or no-oil marinades to flavor foods.       Fatigue If you have fatigue, you feel tired all the time and have a lack of energy or a lack of motivation. Fatigue may make it difficult to start or complete tasks because of exhaustion. In general, occasional or mild fatigue is often a normal response to activity or life. However, long-lasting (chronic) or extreme fatigue may be a symptom of a medical condition. Follow these instructions at home: General instructions  Watch your fatigue for any changes.  Go to bed and get up at the same time every day.  Avoid fatigue by pacing yourself during the day and getting enough sleep at night.  Maintain a healthy weight. Medicines  Take over-the-counter and prescription medicines only as told by your health care provider.  Take a multivitamin, if told by your health care provider.  Do not use herbal or dietary supplements unless they are approved by your health care provider. Activity   Exercise regularly, as told by your health care provider.  Use or practice techniques to help you relax, such as yoga, tai chi, meditation, or massage therapy. Eating and drinking   Avoid heavy meals in the evening.  Eat a well-balanced diet, which includes lean proteins, whole grains, plenty of fruits and vegetables, and low-fat dairy products.  Avoid consuming too much caffeine.  Avoid the use of alcohol.  Drink enough fluid to keep your urine pale yellow. Lifestyle  Change situations that cause you stress. Try to keep your work and personal schedule in balance.  Do not use any products that contain nicotine or tobacco, such as cigarettes and e-cigarettes. If you need help quitting, ask your  health care provider.  Do not use drugs. Contact a health care provider if:  Your fatigue does not get better.  You have a fever.  You suddenly lose or gain weight.  You have headaches.  You have trouble falling asleep or sleeping through the night.  You feel angry, guilty, anxious, or sad.  You are unable to have a bowel movement (constipation).  Your skin is dry.  You have swelling in your legs or another part of your body. Get help right away if:  You feel confused.  Your vision is blurry.  You feel faint or you pass out.  You have a severe headache.  You have severe pain in your abdomen, your back, or the area between your waist and hips (pelvis).  You have chest pain, shortness of breath, or an irregular or fast heartbeat.  You are unable to urinate, or you urinate less than normal.  You have abnormal bleeding, such as bleeding from the rectum, vagina, nose, lungs, or nipples.  You vomit blood.  You have thoughts about hurting yourself or others. If you ever feel like you may hurt yourself or others, or have thoughts about taking your own life, get help right away. You can go to your nearest emergency department or call:  Your local emergency services (911 in the [place]  A suicide crisis helpline, such as the [place] at 631-773-0102. This is open 24 hours a day. Summary  If you have fatigue, you feel tired all the time and have a lack of energy or a lack of motivation.  Fatigue may make it difficult to start or complete tasks because of exhaustion.  Long-lasting (chronic) or extreme fatigue may be a symptom of a medical condition.  Exercise regularly, as told by your health care provider.  Change situations that cause you stress. Try to keep your work and personal schedule in balance.  This information is not intended to replace advice given to you by your health care provider. Make sure you discuss any questions you have  with your health care provider. Document Released: 05/20/2007 Document Revised: 11/13/2018 Document Reviewed: 04/17/2017 Elsevier Patient Education  2020 [employer].

## 2019-04-01 NOTE — Progress Notes (Signed)
Telehealth office visit note for Seth Rios, D.O- at [facility] at [facility]   I connected with current patient today and verified that I am speaking with the correct person using two identifiers.   . Location of the patient: Home . Location of the provider: Office Only the patient (+/- their family members at pt's discretion) and myself were participating in the encounter    - This visit type was conducted due to national recommendations for restrictions regarding the COVID-19 Pandemic (e.g. social distancing) in an effort to limit this patient's exposure and mitigate transmission in our community.  This format is felt to be most appropriate for this patient at this time.   - The patient did not have access to video technology or had technical difficulties with video requiring transitioning to audio format only. - No physical exam could be performed with this format, beyond that communicated to [place] by the patient/ family members as noted.   - Additionally my office staff/ schedulers discussed with the patient that there may be a monetary charge related to this service, depending on their medical insurance.   The patient expressed understanding, and agreed to proceed.       History of Present Illness:  He is ready to review his labs today.  Notes he just laid eyes on them a moment ago.  States he occasionally takes a multivitamin, but is "not great at remembering to take it." No vit D supp though  Per patient, has never been told his cholesterol was bad in the past, nor that he had pre-DM.   He continues experiencing fatigue, and experiencing marital stress.    Notes he could improve his diet, exercise and overall health habits.  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  Notes he also "walks around all day long."  Denies any  repetative mvmnts, denies any old right upper extremity injuries, no old neck injuries etc.    Impression and Recommendations:    1. Other fatigue   2. Low libido   3. Marital stress   4. Elevated lipoprotein(a)   5. Low HDL (under 40)   6. Vitamin D insufficiency   7. Glucose intolerance (impaired glucose tolerance)   8. Tremor of right hand      - Reviewed recent lab work (03/24/2019) in depth with patient today.  All lab work within normal limits unless otherwise noted.  Extensive education provided and all questions answered.  Vitamin D Insufficiency  - Discussed that at 36.2, patient's vitamin D is WNL but not optimal. - Discussed increasing patient's vitamin D to 40-60 range. - Advised patient to add OTC Vitamin D 5000 IUs to daily regimen.  Lipid Panel - Elevated Lipoprotein(a), Low HDL-   this is a new problem for patient today - Triglycerides = 124, WNL. - HDL = 38, low. - LDL = 137, elevated.  - Education provided about cholesterol today.  - Dietary changes such as low saturated & trans fat and low carb diets discussed with patient.  Encouraged regular physical activity and consumption of healthier fats.  - Advised patient to go to the [place] website and educate himself about prudent dietary and lifestyle adjustments.  - Re-check lipid panel 4 months along with A1c and vitamin D. - Will continue to monitor.  HbA1c = 5.7, Glucose Intolerance-  new onset for patient today - Educated patient regarding the HbA1c measurement value and meaning today. - Discussed need to engage in regular physical activity and avoid carb-rich diet. - Encouraged patient to go to the American Diabetes Association website and educate himself about prudent lifestyle habits and diabetes prevention. - Handouts were provided and patient will be coming in to get them in the near future. - Will continue to monitor.  Fatigue - Reviewed potential causes of pt sx and all  questions were answered. - Discussed that patient's labs indicate that he may be engaging in sub optimal health habits. - Reviewed that poor diet and lifestyle can lead to elevated A1c, hyperlipidemia, and fatigue. - To improve fatigue, discussed importance of eating a high antioxidant diet and engaging in regular physical activity. -Also suggested that his marital stress could be the cause of fatigue and often this is a symptom of stress and/or depression. - Will continue to monitor, will obtain additional labs, see orders below  Tremor of Right Hand - Extensively discussed potential causes of patient symptoms and all questions were answered. - Reviewed that stress and caffeine, poor diet etc., may all contribute to tremors. - Advised patient to pay attention to repetitive movements involving his right hand as this may be a nerve impingement from overuse injury. - If possible, advised patient to modify his habits to avoid repetitive movements in case this is playing a role. - If his sx do not improve after avoiding repetitive motions, eating healthier and exercising regularly, advised patient that the next step would be to consult with neurology and have further testing/evaluation done possible EMG studies etc and or we would need to look at the C-spine for nerve impingement that could be causing the symptoms as well but this is less likely to cause tremor. -Also if symptoms worsen we can always consider propranolol in the future as needed - Will continue to monitor.  Health Counseling & Preventative Health Maintenance - Advised patient to continue working toward daily physical activity, especially a formal exercise routine, to improve overall mental, physical, and emotional health.  Recommended that the patient eventually strive for at least 150 minutes of moderate cardiovascular activity per week according to guidelines established by the [facility].   - Reviewed the "spokes of the wheel" of mood and  health management.  Stressed the importance of ongoing prudent habits, including regular exercise, appropriate sleep hygiene, healthful dietary habits, and prayer/meditation to relax.  - Healthy dietary habits encouraged, including low-carb, and high amounts of lean protein in diet.   - Patient should also consume adequate amounts of water.  - Health counseling performed.  All questions answered.  Recommendations -Due to fatigue and low libido-need for further lab work.  See orders. - Patient will return early in the morning to check testosterone, magnesium, phosphorous, and B12.   - Otherwise, re-check labs in 4 months and return for f/up as recommended. - Will follow up in person or via tele-health, whichever pt prefers.    - As part of my medical decision making, I reviewed the following data within the electronic MEDICAL RECORD NUMBER History obtained from pt /family, CMA notes reviewed and incorporated if applicable, Labs reviewed, Radiograph/ tests reviewed if applicable and OV notes from prior OV's with me, as well as other specialists she/he has seen since seeing me last, were all reviewed and used in my medical decision making process today.   - Additionally, discussion had with patient regarding txmnt plan, and their  biases/concerns about that plan were used in my medical decision making today.   - The patient agreed with the plan and demonstrated an understanding of the instructions.   No barriers to understanding were identified.   - Red flag symptoms and signs discussed in detail.  Patient expressed understanding regarding what to do in case of emergency\ urgent symptoms.  The patient was advised to call back or seek an in-person evaluation if the symptoms worsen or if the condition fails to improve as anticipated.   Return for near future early am bldwrk; OV 11mo- reck FLP, Vit D, A1c, others prn, then OV 3 d later.    Orders Placed This Encounter  Procedures  . Testosterone, Free,  Total, SHBG  . Magnesium  . Phosphorus  . B12    Meds ordered this encounter  Medications  . Cholecalciferol (VITAMIN D3) 125 MCG (5000 UT) TABS    Sig: 5,000 IU OTC vitamin D3 daily.    Dispense:  90 tablet    Refill:  3   I provided 25+ minutes of non-face-to-face time during this encounter,with over 50% of the time in direct counseling on patients medical conditions/ medical concerns.  Additional time was spent with charting and coordination of care after the actual visit commenced.   Note:  This note was prepared with assistance of Dragon voice recognition software. Occasional wrong-word or sound-a-like substitutions may have occurred due to the inherent limitations of voice recognition software.  This document serves as a record of services personally performed by Thomasene Loteborah Omara Alcon, DO. It was created on her behalf by Peggye FothergillKatherine Galloway, a trained medical scribe. The creation of this record is based on the scribe's personal observations and the provider's statements to them.   I have reviewed the above medical documentation for accuracy and completeness and I concur.  Thomasene Loteborah Afrah Burlison, DO 04/01/2019 11:58 AM        Patient Care Team    Relationship Specialty Notifications Start End  Thomasene Lotpalski, Argus Caraher, DO PCP - General Family Medicine  03/09/19      -Vitals obtained; medications/ allergies reconciled;  personal medical, social, Sx etc.histories were updated by CMA, reviewed by me and are reflected in chart   Recent Results (from the past 2160 hour(s))  T3     Status: None   Collection Time: 03/24/19  9:28 AM  Result Value Ref Range   T3, Total 166 71 - 180 ng/dL  T4, free     Status: None   Collection Time: 03/24/19  9:28 AM  Result Value Ref Range   Free T4 1.56 0.82 - 1.77 ng/dL  VITAMIN D 25 Hydroxy (Vit-D Deficiency, Fractures)     Status: None   Collection Time: 03/24/19  9:28 AM  Result Value Ref Range   Vit D, 25-Hydroxy 36.2 30.0 - 100.0 ng/mL    Comment:  Vitamin D deficiency has been defined by the Institute of Medicine and an Endocrine Society practice guideline as a level of serum 25-OH vitamin D less than 20 ng/mL (1,2). The Endocrine Society went on to further define vitamin D insufficiency as a level between 21 and 29 ng/mL (2). 1. IOM (Institute of Medicine). 2010. Dietary reference    intakes for calcium and D. [place]: The    [employer]. 2. Holick MF, Binkley Clemons, Bischoff-Ferrari HA, et al.    Evaluation, treatment, and prevention of vitamin D    deficiency: an Endocrine Society clinical practice    guideline. JCEM. 2011 Jul; 96(7):1911-30.  TSH     Status: None   Collection Time: 03/24/19  9:28 AM  Result Value Ref Range   TSH 1.270 0.450 - 4.500 uIU/mL  Lipid panel     Status: Abnormal   Collection Time: 03/24/19  9:28 AM  Result Value Ref Range   Cholesterol, Total 200 (H) 100 - 199 mg/dL   Triglycerides 161124 0 - 149 mg/dL   HDL 38 (L) >09>39 mg/dL   VLDL Cholesterol Cal 25 5 - 40 mg/dL   LDL Calculated 604137 (H) 0 - 99 mg/dL   Chol/HDL Ratio 5.3 (H) 0.0 - 5.0 ratio    Comment:                                   T. Chol/HDL Ratio                                             Men  Women                               1/2 Avg.Risk  3.4    3.3                                   Avg.Risk  5.0    4.4                                2X Avg.Risk  9.6    7.1                                3X Avg.Risk 23.4   11.0   Hemoglobin A1c     Status: Abnormal   Collection Time: 03/24/19  9:28 AM  Result Value Ref Range   Hgb A1c MFr Bld 5.7 (H) 4.8 - 5.6 %    Comment:          Prediabetes: 5.7 - 6.4          Diabetes: >6.4          Glycemic control for adults with diabetes: <7.0    Est. average glucose Bld gHb Est-mCnc 117 mg/dL  Comprehensive metabolic panel     Status: Abnormal   Collection Time: 03/24/19  9:28 AM  Result Value Ref Range   Glucose 99 65 - 99 mg/dL   BUN 12 6 - 20 mg/dL   Creatinine, Ser 5.400.94 0.76 -  1.27 mg/dL   GFR calc non Af Amer 102 >59 mL/min/1.73   GFR calc Af Amer 118 >59 mL/min/1.73   BUN/Creatinine Ratio 13 9 - 20   Sodium 142 134 - 144 mmol/L   Potassium 5.1 3.5 - 5.2 mmol/L   Chloride 103 96 - 106 mmol/L   CO2 23 20 - 29 mmol/L   Calcium 9.9 8.7 - 10.2 mg/dL   Total Protein 6.8 6.0 - 8.5 g/dL   Albumin 4.8 4.0 - 5.0 g/dL   Globulin, Total 2.0 1.5 - 4.5 g/dL   Albumin/Globulin Ratio 2.4 (H) 1.2 - 2.2   Bilirubin Total 0.6 0.0 - 1.2 mg/dL  Alkaline Phosphatase 108 39 - 117 IU/L   AST 16 0 - 40 IU/L   ALT 16 0 - 44 IU/L  CBC with Differential/Platelet     Status: Abnormal   Collection Time: 03/24/19  9:28 AM  Result Value Ref Range   WBC 7.5 3.4 - 10.8 x10E3/uL   RBC 5.13 4.14 - 5.80 x10E6/uL   Hemoglobin 17.0 13.0 - 17.7 g/dL   Hematocrit 16.1 09.6 - 51.0 %   MCV 94 79 - 97 fL   MCH 33.1 (H) 26.6 - 33.0 pg   MCHC 35.1 31.5 - 35.7 g/dL   RDW 04.5 40.9 - 81.1 %   Platelets 217 150 - 450 x10E3/uL   Neutrophils 67 Not Estab. %   Lymphs 23 Not Estab. %   Monocytes 8 Not Estab. %   Eos 1 Not Estab. %   Basos 1 Not Estab. %   Neutrophils Absolute 5.1 1.4 - 7.0 x10E3/uL   Lymphocytes Absolute 1.7 0.7 - 3.1 x10E3/uL   Monocytes Absolute 0.6 0.1 - 0.9 x10E3/uL   EOS (ABSOLUTE) 0.1 0.0 - 0.4 x10E3/uL   Basophils Absolute 0.0 0.0 - 0.2 x10E3/uL   Immature Granulocytes 0 Not Estab. %   Immature Grans (Abs) 0.0 0.0 - 0.1 x10E3/uL      Patient Active Problem List   Diagnosis Date Noted  . Low HDL (under 40) 04/01/2019  . 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 Marital stress 04/01/2019  . Low libido 04/01/2019  . Other fatigue 04/01/2019  . Vitamin D insufficiency 04/01/2019  . Glucose intolerance (impaired glucose tolerance) 04/01/2019  . Tremor of right hand 04/01/2019     No outpatient medications have been marked as taking for the 04/01/19 encounter (Office Visit) with Thomasene Lot, DO.     Allergies:  No Known Allergies   ROS:  See above HPI  for pertinent positives and negatives   Objective:   Height 5\' 9"  (1.753 m), weight 158 lb 6.4 oz (71.8 kg).  (if some vitals are omitted, this means that patient was UNABLE to obtain them even though they were asked to get them prior to OV today.  They were asked to call us at their earliest convenience with these once obtained. )  General: A & O * 3; sounds in no acute distress; in usual state of health.  Skin: Pt confirms warm and dry extremities and pink fingertips HEENT: Pt confirms lips non-cyanotic Chest: Patient confirms normal chest excursion and movement Respiratory: speaking in full sentences, no conversational dyspnea; patient confirms no use of accessory muscles Psych: insight appears good, mood- appears full

## 2019-04-07 ENCOUNTER — Ambulatory Visit (INDEPENDENT_AMBULATORY_CARE_PROVIDER_SITE_OTHER): Payer: Self-pay | Admitting: Gastroenterology

## 2019-04-07 ENCOUNTER — Other Ambulatory Visit: Payer: Self-pay

## 2019-04-07 ENCOUNTER — Encounter: Payer: Self-pay | Admitting: Gastroenterology

## 2019-04-07 VITALS — BP 110/80 | HR 80 | Temp 98.1°F | Ht 68.5 in | Wt 160.0 lb

## 2019-04-07 DIAGNOSIS — R194 Change in bowel habit: Secondary | ICD-10-CM

## 2019-04-07 DIAGNOSIS — R197 Diarrhea, unspecified: Secondary | ICD-10-CM

## 2019-04-07 NOTE — Progress Notes (Signed)
History of Present Illness: This is a 38 year old male referred by Thomasene Lotpalski, Deborah, DO for the evaluation of nausea, diarrhea, urgency, change in bowel habits since March. CMP, CBC, TSH, T4 in 03/2019 all unremarkable.  He has made several diet changes with reduction in fast foods, increase fiber content, decreased milk product intake and tried probiotics with no apparent help.  He substantially increased his water intake and nausea has abated.  No travel, antibiotics, close contacts with similar symptoms. Denies weight loss, abdominal pain, constipation, change in stool caliber, melena, hematochezia, vomiting, dysphagia, reflux symptoms, chest pain.    No Known Allergies Outpatient Medications Prior to Visit  Medication Sig Dispense Refill  . Cholecalciferol (VITAMIN D3) 125 MCG (5000 UT) TABS 5,000 IU OTC vitamin D3 daily. 90 tablet 3  . Multiple Vitamin (MULTIVITAMIN) tablet Take 1 tablet by mouth daily.     No facility-administered medications prior to visit.    Past Medical History:  Diagnosis Date  . Asthma    as a child   Past Surgical History:  Procedure Laterality Date  . WISDOM TOOTH EXTRACTION     Social History   Socioeconomic History  . Marital status: Married    Spouse name: Not on file  . Number of children: 0  . Years of education: Not on file  . Highest education level: Not on file  Occupational History  . Occupation: Receiving Print production plannerLead warehouse  Social Needs  . Financial resource strain: Not on file  . Food insecurity    Worry: Not on file    Inability: Not on file  . Transportation needs    Medical: Not on file    Non-medical: Not on file  Tobacco Use  . Smoking status: Current Every Day Smoker    Packs/day: 1.00    Years: 10.00    Pack years: 10.00    Types: Cigarettes  . Smokeless tobacco: Never Used  Substance and Sexual Activity  . Alcohol use: Yes    Alcohol/week: 1.0 standard drinks    Types: 1 Standard drinks or equivalent per week  .  Drug use: Not on file  . Sexual activity: Not Currently  Lifestyle  . Physical activity    Days per week: Not on file    Minutes per session: Not on file  . Stress: Not on file  Relationships  . Social Musicianconnections    Talks on phone: Not on file    Gets together: Not on file    Attends religious service: Not on file    Active member of club or organization: Not on file    Attends meetings of clubs or organizations: Not on file    Relationship status: Not on file  Other Topics Concern  . Not on file  Social History Narrative  . Not on file   Family History  Problem Relation Age of Onset  . Skin cancer Father   . Diabetes Father   . Skin cancer Brother   . Prostate cancer Paternal Uncle   . Stroke Maternal Grandfather   . Alzheimer's disease Maternal Grandfather   . Parkinson's disease Maternal Grandfather   . Pneumonia Maternal Grandfather   . High Cholesterol Other   . Hypertension Other   . Heart disease Mother   . Diabetes Mother   . Alcohol abuse Paternal Grandfather   . Emphysema Paternal Grandfather        smoker       Review of Systems: Pertinent positive and negative  review of systems were noted in the above HPI section. All other review of systems were otherwise negative.   Physical Exam: General: Well developed, well nourished, no acute distress Head: Normocephalic and atraumatic Eyes:  sclerae anicteric, EOMI Ears: Normal auditory acuity Mouth: No deformity or lesions Neck: Supple, no masses or thyromegaly Lungs: Clear throughout to auscultation Heart: Regular rate and rhythm; no murmurs, rubs or bruits Abdomen: Soft, non tender and non distended. No masses, hepatosplenomegaly or hernias noted. Normal Bowel sounds Rectal: Deferred to colonoscopy Musculoskeletal: Symmetrical with no gross deformities  Skin: No lesions on visible extremities Pulses:  Normal pulses noted Extremities: No clubbing, cyanosis, edema or deformities noted Neurological: Alert  oriented x 4, grossly nonfocal Cervical Nodes:  No significant cervical adenopathy Inguinal Nodes: No significant inguinal adenopathy Psychological:  Alert and cooperative. Normal mood and affect   Assessment and Recommendations:  1. Diarrhea, change in bowel habits, urgency.  Nausea has resolved. Stool hemoccults. GI pathogen panel. Trial of several probiotics each for about 2 weeks each. Schedule colonoscopy if GI pathogen panel is not diagnostic. The risks (including bleeding, perforation, infection, missed lesions, medication reactions and possible hospitalization or surgery if complications occur), benefits, and alternatives to colonoscopy with possible biopsy and possible polypectomy were discussed with the patient and they consent to proceed.     cc: Mellody Dance, DO [address] [place],  [place] [address]

## 2019-04-07 NOTE — Patient Instructions (Signed)
Your provider has requested that you go to the basement level for lab work before leaving today. Press "B" on the elevator. The lab is located at the first door on the left as you exit the elevator.  Follow the instructions on the Hemoccult cards and mail them back to [place] when you are finished or you may take them directly to the lab in the basement of the [place] building. We will call you with the results.   Start a trial of probiotics daily x 2 weeks such as Electronics engineer, Publishing copy, or [employer].   Normal BMI (Body Mass Index- based on height and weight) is between 19 and 25. Your BMI today is Body mass index is 23.97 kg/m. Marland Kitchen Please consider follow up  regarding your BMI with your Primary Care Provider.  Thank you for choosing me and [facility].  Pricilla Riffle. Dagoberto Ligas., MD., [facility]

## 2019-04-22 ENCOUNTER — Other Ambulatory Visit: Payer: Self-pay

## 2019-04-22 ENCOUNTER — Other Ambulatory Visit (INDEPENDENT_AMBULATORY_CARE_PROVIDER_SITE_OTHER): Payer: Self-pay

## 2019-04-22 DIAGNOSIS — R197 Diarrhea, unspecified: Secondary | ICD-10-CM

## 2019-04-22 DIAGNOSIS — R194 Change in bowel habit: Secondary | ICD-10-CM

## 2019-04-22 DIAGNOSIS — R251 Tremor, unspecified: Secondary | ICD-10-CM

## 2019-04-22 DIAGNOSIS — R5383 Other fatigue: Secondary | ICD-10-CM

## 2019-04-22 DIAGNOSIS — R6882 Decreased libido: Secondary | ICD-10-CM

## 2019-04-23 LAB — HEMOCCULT SLIDES (X 3 CARDS)
Fecal Occult Blood: NEGATIVE
OCCULT 1: NEGATIVE
OCCULT 2: NEGATIVE
OCCULT 3: NEGATIVE
OCCULT 4: NEGATIVE
OCCULT 5: NEGATIVE

## 2019-04-24 LAB — GASTROINTESTINAL PATHOGEN PANEL PCR
C. difficile Tox A/B, PCR: NOT DETECTED
Campylobacter, PCR: NOT DETECTED
Cryptosporidium, PCR: NOT DETECTED
E coli (ETEC) LT/ST PCR: NOT DETECTED
E coli (STEC) stx1/stx2, PCR: NOT DETECTED
E coli 0157, PCR: NOT DETECTED
Giardia lamblia, PCR: NOT DETECTED
Norovirus, PCR: NOT DETECTED
Rotavirus A, PCR: NOT DETECTED
Salmonella, PCR: NOT DETECTED
Shigella, PCR: NOT DETECTED

## 2019-04-26 LAB — TESTOSTERONE, FREE, TOTAL, SHBG
Sex Hormone Binding: 40.3 nmol/L (ref 16.5–55.9)
Testosterone, Free: 6.8 pg/mL (ref 6.8–21.5)
Testosterone: 491 ng/dL (ref 264–916)

## 2019-04-26 LAB — MAGNESIUM: Magnesium: 2.3 mg/dL (ref 1.6–2.3)

## 2019-04-26 LAB — PHOSPHORUS: Phosphorus: 3 mg/dL (ref 2.8–4.1)

## 2019-04-26 LAB — VITAMIN B12: Vitamin B-12: 607 pg/mL (ref 232–1245)

## 2019-04-29 ENCOUNTER — Telehealth: Payer: Self-pay | Admitting: Family Medicine

## 2019-04-29 NOTE — Telephone Encounter (Signed)
Pt called w/ High Anxiety regarding [place] issue w/ spouse, request to speak w/ provider Aldean Ast ( advised both in rooms w/patient) -- I caught CMA in route w/pt to room spk with her about pt's needs & unfortunately advised Dr. Jenetta Downer had no appt availability today & pt has upcoming appt tomorrow but says can't wait he is very upset & needs to speak to someone immediately--- She advises pt be  Referred/ directed to [facility] ED for Immediate Medical Svcs.  --Directed pt to [facility] for faster service ( pt agreeable )  --Forwarding message to medical staff.  --Dion Body

## 2019-05-01 ENCOUNTER — Encounter: Payer: Self-pay | Admitting: Family Medicine

## 2019-05-01 ENCOUNTER — Other Ambulatory Visit: Payer: Self-pay

## 2019-05-01 ENCOUNTER — Telehealth: Payer: Self-pay | Admitting: Family Medicine

## 2019-05-01 ENCOUNTER — Ambulatory Visit (INDEPENDENT_AMBULATORY_CARE_PROVIDER_SITE_OTHER): Payer: Self-pay | Admitting: Family Medicine

## 2019-05-01 VITALS — Temp 97.3°F | Ht 69.0 in | Wt 151.2 lb

## 2019-05-01 DIAGNOSIS — F329 Major depressive disorder, single episode, unspecified: Secondary | ICD-10-CM

## 2019-05-01 DIAGNOSIS — F430 Acute stress reaction: Secondary | ICD-10-CM

## 2019-05-01 DIAGNOSIS — F411 Generalized anxiety disorder: Secondary | ICD-10-CM

## 2019-05-01 DIAGNOSIS — F5104 Psychophysiologic insomnia: Secondary | ICD-10-CM

## 2019-05-01 DIAGNOSIS — F410 Panic disorder [episodic paroxysmal anxiety] without agoraphobia: Secondary | ICD-10-CM

## 2019-05-01 MED ORDER — FLUOXETINE HCL 20 MG PO TABS: ORAL_TABLET | ORAL | 1 refills | Status: DC

## 2019-05-01 MED ORDER — ALPRAZOLAM 0.5 MG PO TABS: 0.2500 mg | ORAL_TABLET | ORAL | 0 refills | Status: AC | PRN

## 2019-05-01 NOTE — Progress Notes (Signed)
Telehealth office visit note for Seth Rios, D.O- at Primary Care at [facility]   I connected with current patient today and verified that I am speaking with the correct person using two identifiers.   . Location of the patient: Home . Location of the provider: Office Only the patient (+/- their family members at pt's discretion) and myself were participating in the encounter    - This visit type was conducted due to national recommendations for restrictions regarding the COVID-19 Pandemic (e.g. social distancing) in an effort to limit this patient's exposure and mitigate transmission in our community.  This format is felt to be most appropriate for this patient at this time.   - The patient did not have access to video technology or had technical difficulties with video requiring transitioning to audio format only. - No physical exam could be performed with this format, beyond that communicated to [place] by the patient/ family members as noted.   - Additionally my office staff/ schedulers discussed with the patient that there may be a monetary charge related to this service, depending on their medical insurance.   The patient expressed understanding, and agreed to proceed.      History of Present Illness:   Says "I've had a hell of a lot better days than today."  - Wife Involuntarily Committed Had to file involuntary commitment papers on his wife on Tuesday.  "I don't know the diagnosis yet but she had a complete mental breakdown; some sort of psychosis, completely delusional, threatening; I had to call the cops a couple of times."  Says "It's been bad."  "I had to make a midnight run to get my stepdaughter out of the house to her dad's."  Says it's "been a really rough week."  Found out the facility she's in yesterday afternoon, in [place].  - Acute Anxiety due to Situation with Wife Says he's been experiencing a lot of stress.  "I've probably cried more in the last 72  hours than I have in my entire life."  Says "I don't feel depressed at all, I'm just overly anxious, I can't concentrate, everything is a lot."  - Sleep Habits during Acute Stress Says he's been taking melatonin pills for the past 2-3 weeks; taking a 5 mg dose of that before bed.  The last few nights, the took two of them to help him get to sleep.  Confirms sleeping okay when he takes melatonin; "I am getting some sleep."  - Eating Habits during Acute Stress Says his diet has been shit.  "I was finally able to eat more than two bites of something last night."  Says "I'm hungry, but I have a bite of something and my stomach's like meh, I don't want to eat any more of that."  He has continued taking his vitamins.  Says his GI symptoms were starting to get better just starting to visit the clinic here, and going to his GI appointment.  Notes that he received his results from GI and was told "everything came back normal, have a nice day."  But notes that his symptoms have continued occurring and gotten worse again with everything that's happened this week.  - Counseling during Acute Stress Notes his wife has a Social worker and he got consent to speak with his wife's counselor; took his wife's appointment this week.  Says "I had a 4 and a half hour conversation with my mother on Monday; we went through probably  20 years worth of therapy through that conversation alone."  He's been in contact with his mother "pretty much constantly since Monday."  Says "I have people to talk to.  I don't feel like I need that right now."  - History of Mood Management Has never been managed on mood medicines in the past.  Notes his mother takes Xanax occasionally; she's been a Social research officer, government for [employer] for 26 years.  Was told "it makes everything not as chaotic."  Feels like he needs things to be not as chaotic "so I can actually think through what's going on."  States he took one Xanax in the past, on the day of his wedding,  and "it was what I needed that day."  Thinks he had one episode of panic since this started, "I got through that."  When asked if he has any thoughts of harming himself or harming others, he states he does not currently have them.     No flowsheet data found.  Depression screen [facility] 2/9 04/01/2019 03/09/2019  Decreased Interest 0 0  Down, Depressed, Hopeless 0 0  PHQ - 2 Score 0 0      Impression and Recommendations:    1. Anxiety as acute reaction to exceptional stress   2. Reactive depression   3. Psychophysiological insomnia   4. Panic attack      - Advised patient to download GoodRx app on phone.  Anxiety as Acute Reaction to Exceptional Stress Reactive Depression, Psychophysiological Insomnia, Panic Attack - Discussed patient's symptoms at length today.  All questions answered.  - Recommended prescription medication to help manage patient's ongoing current anxiety. - Explained the difference between options for management.  - Recommended beginning fluoxetine today.  See med list. - Discussed tapering dosage up from very low to full strength. - Advised patient to eat a meal and take this medication after eating. - Start with one half tablet for four days and then increase to full tablet.  - Xanax prescribed today for panic.  See med list. - Reviewed risks and benefits of Xanax, and prudent appropriate use of medication.  Discussed that Xanax should be used ONLY for occasional acute episodes of panic, and that we will provide only one prescription of 30 tablets to last 6-12 months.   - Discussed that we will ONLY prescribe Xanax for panic, not for inappropriate use, such as in management of chronic anxiety or aiding with sleep.  - Extensive education provided and discussion held.  All questions answered.  - Encouraged patient to follow up with therapist as scheduled.  - Patient knows that if he begins to feel suicidal or homicidal ideations, he should call for emergency  behavioral health assistance.  - In addition to prescription intervention and therapy/counseling, reviewed the "spokes of the wheel" of mood and health management.  Stressed the importance of ongoing prudent habits, including regular exercise, appropriate sleep hygiene, healthful dietary habits, and prayer/meditation to relax.  - Encouraged patient to exercise to help release stress.  - Will continue to monitor closely.  - Health counseling performed.  All questions answered.  Recommendations - Return in 2-4 weeks PRN for mood management. - Patient knows to call in PRN for acute concerns.  - As part of my medical decision making, I reviewed the following data within the electronic MEDICAL RECORD NUMBER History obtained from pt /family, CMA notes reviewed and incorporated if applicable, Labs reviewed, Radiograph/ tests reviewed if applicable and OV notes from prior OV's with me, as  well as other specialists she/he has seen since seeing me last, were all reviewed and used in my medical decision making process today.   - Additionally, discussion had with patient regarding txmnt plan, and their biases/concerns about that plan were used in my medical decision making today.   - The patient agreed with the plan and demonstrated an understanding of the instructions.   No barriers to understanding were identified.   - Red flag symptoms and signs discussed in detail.  Patient expressed understanding regarding what to do in case of emergency\ urgent symptoms.  The patient was advised to call back or seek an in-person evaluation if the symptoms worsen or if the condition fails to improve as anticipated.   No follow-ups on file.    No orders of the defined types were placed in this encounter.   Meds ordered this encounter  Medications  . FLUoxetine (PROZAC) 20 MG tablet    Sig: 1/2 tab for 4 d, then 1 po qd    Dispense:  90 tablet    Refill:  1  . ALPRAZolam (XANAX) 0.5 MG tablet    Sig: Take 0.5  tablets (0.25 mg total) by mouth as needed (only for panic attacks).    Dispense:  30 tablet    Refill:  0    There are no discontinued medications.    I provided 24 minutes of non face-to-face time during this encounter.  Additional time was spent with charting and coordination of care after the actual visit commenced.   Note:  This note was prepared with assistance of Dragon voice recognition software. Occasional wrong-word or sound-a-like substitutions may have occurred due to the inherent limitations of voice recognition software.  This document serves as a record of services personally performed by Thomasene Loteborah Yazaira Speas, DO. It was created on her behalf by Peggye FothergillKatherine Galloway, a trained medical scribe. The creation of this record is based on the scribe's personal observations and the provider's statements to them.   I have reviewed the above medical documentation for accuracy and completeness and I concur.  Thomasene Loteborah Donelda Mailhot, DO 05/04/2019 9:43 AM       Patient Care Team    Relationship Specialty Notifications Start End  Thomasene Lotpalski, Wren Pryce, DO PCP - General Family Medicine  03/09/19      -Vitals obtained; medications/ allergies reconciled;  personal medical, social, Sx etc.histories were updated by CMA, reviewed by me and are reflected in chart   Patient Active Problem List   Diagnosis Date Noted  . Low HDL (under 40) 04/01/2019  . 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 Marital stress 04/01/2019  . Low libido 04/01/2019  . Other fatigue 04/01/2019  . Vitamin D insufficiency 04/01/2019  . Glucose intolerance (impaired glucose tolerance) 04/01/2019  . Tremor of right hand 04/01/2019     Current Meds  Medication Sig  . Cholecalciferol (VITAMIN D3) 125 MCG (5000 UT) TABS 5,000 IU OTC vitamin D3 daily.  . Melatonin 5 MG TABS Take by mouth.  . Multiple Vitamin (MULTIVITAMIN) tablet Take 1 tablet by mouth daily.     Allergies:  No Known Allergies   ROS:  See above HPI for  pertinent positives and negatives   Objective:   Temperature (!) 97.3 F (36.3 C), height 5\' 9"  (1.753 m), weight 151 lb 3.2 oz (68.6 kg).  (if some vitals are omitted, this means that patient was UNABLE to obtain them even though they were asked to get them prior to OV today.  They were asked to  call us at their earliest convenience with these once obtained. )  General: A & O * 3; sounds in no acute distress; in usual state of health.  Skin: Pt confirms warm and dry extremities and pink fingertips HEENT: Pt confirms lips non-cyanotic Chest: Patient confirms normal chest excursion and movement Respiratory: speaking in full sentences, no conversational dyspnea; patient confirms no use of accessory muscles Psych: insight appears good, mood- appears full

## 2019-05-01 NOTE — Telephone Encounter (Signed)
Patient called back request that Dr. Jenetta Downer (NOT send escript to [facility]  For ) :  FLUoxetine (PROZAC) 20 MG tablet [[id number]]   Order Details Dose, Route, Frequency: As Directed  Dispense Quantity: 90 tablet Refills: 1 Fills remaining: --        Sig: 1/2 tab for 4 d, then 1 po qd          ---Patient request paper Rx (says he will take them to pharmacy himself)  --Forwarding message to medical assistant.  -glh

## 2019-05-01 NOTE — Telephone Encounter (Signed)
Patient requested printed script due to insurance issues.  Called and cancelled original prescription.

## 2020-04-29 ENCOUNTER — Emergency Department (HOSPITAL_COMMUNITY): Payer: PRIVATE HEALTH INSURANCE

## 2020-04-29 ENCOUNTER — Inpatient Hospital Stay (HOSPITAL_COMMUNITY)
Admission: EM | Admit: 2020-04-29 | Discharge: 2020-05-01 | DRG: 965 | Disposition: A | Discharge status: Home / Self Care | Payer: PRIVATE HEALTH INSURANCE | Attending: Surgery | Admitting: Surgery

## 2020-04-29 ENCOUNTER — Encounter (HOSPITAL_COMMUNITY): Payer: Self-pay

## 2020-04-29 ENCOUNTER — Encounter (INDEPENDENT_AMBULATORY_CARE_PROVIDER_SITE_OTHER): Payer: Self-pay | Admitting: Physician Assistant

## 2020-04-29 ENCOUNTER — Other Ambulatory Visit: Payer: Self-pay

## 2020-04-29 DIAGNOSIS — S36116A Major laceration of liver, initial encounter: Secondary | ICD-10-CM | POA: Diagnosis present

## 2020-04-29 DIAGNOSIS — S225XXA Flail chest, initial encounter for closed fracture: Secondary | ICD-10-CM | POA: Diagnosis present

## 2020-04-29 DIAGNOSIS — T1490XA Injury, unspecified, initial encounter: Secondary | ICD-10-CM | POA: Diagnosis not present

## 2020-04-29 DIAGNOSIS — Z20822 Contact with and (suspected) exposure to covid-19: Secondary | ICD-10-CM | POA: Diagnosis present

## 2020-04-29 DIAGNOSIS — R7989 Other specified abnormal findings of blood chemistry: Secondary | ICD-10-CM | POA: Diagnosis present

## 2020-04-29 DIAGNOSIS — S36899A Unspecified injury of other intra-abdominal organs, initial encounter: Secondary | ICD-10-CM | POA: Diagnosis present

## 2020-04-29 DIAGNOSIS — K8020 Calculus of gallbladder without cholecystitis without obstruction: Secondary | ICD-10-CM | POA: Diagnosis present

## 2020-04-29 DIAGNOSIS — F17210 Nicotine dependence, cigarettes, uncomplicated: Secondary | ICD-10-CM | POA: Diagnosis present

## 2020-04-29 DIAGNOSIS — R918 Other nonspecific abnormal finding of lung field: Secondary | ICD-10-CM | POA: Diagnosis present

## 2020-04-29 DIAGNOSIS — K828 Other specified diseases of gallbladder: Secondary | ICD-10-CM | POA: Diagnosis present

## 2020-04-29 DIAGNOSIS — S36113A Laceration of liver, unspecified degree, initial encounter: Secondary | ICD-10-CM | POA: Diagnosis present

## 2020-04-29 DIAGNOSIS — S2241XA Multiple fractures of ribs, right side, initial encounter for closed fracture: Secondary | ICD-10-CM

## 2020-04-29 HISTORY — DX: Laceration of liver, unspecified degree, initial encounter: S36.113A

## 2020-04-29 LAB — I-STAT CHEM 8, ED
BUN: 25 mg/dL — ABNORMAL HIGH (ref 6–20)
Calcium, Ion: 1.06 mmol/L — ABNORMAL LOW (ref 1.15–1.40)
Chloride: 104 mmol/L (ref 98–111)
Creatinine, Ser: 1 mg/dL (ref 0.61–1.24)
Glucose, Bld: 127 mg/dL — ABNORMAL HIGH (ref 70–99)
HCT: 48 % (ref 39.0–52.0)
Hemoglobin: 16.3 g/dL (ref 13.0–17.0)
Potassium: 3.9 mmol/L (ref 3.5–5.1)
Sodium: 138 mmol/L (ref 135–145)
TCO2: 22 mmol/L (ref 22–32)

## 2020-04-29 LAB — URINALYSIS, ROUTINE W REFLEX MICROSCOPIC
Bilirubin Urine: NEGATIVE
Glucose, UA: NEGATIVE mg/dL
Hgb urine dipstick: NEGATIVE
Ketones, ur: NEGATIVE mg/dL
Leukocytes,Ua: NEGATIVE
Nitrite: NEGATIVE
Protein, ur: NEGATIVE mg/dL
Specific Gravity, Urine: 1.01 (ref 1.005–1.030)
pH: 5.5 (ref 5.0–8.0)

## 2020-04-29 LAB — CBC
HCT: 49.2 % (ref 39.0–52.0)
HCT: 46.5 % (ref 39.0–52.0)
HCT: 44 % (ref 39.0–52.0)
HCT: 40.7 % (ref 39.0–52.0)
Hemoglobin: 16.3 g/dL (ref 13.0–17.0)
Hemoglobin: 16 g/dL (ref 13.0–17.0)
Hemoglobin: 15.2 g/dL (ref 13.0–17.0)
Hemoglobin: 13.7 g/dL (ref 13.0–17.0)
MCH: 32.1 pg (ref 26.0–34.0)
MCH: 33 pg (ref 26.0–34.0)
MCH: 32.6 pg (ref 26.0–34.0)
MCH: 31.9 pg (ref 26.0–34.0)
MCHC: 33.1 g/dL (ref 30.0–36.0)
MCHC: 34.4 g/dL (ref 30.0–36.0)
MCHC: 34.5 g/dL (ref 30.0–36.0)
MCHC: 33.7 g/dL (ref 30.0–36.0)
MCV: 96.9 fL (ref 80.0–100.0)
MCV: 95.9 fL (ref 80.0–100.0)
MCV: 94.4 fL (ref 80.0–100.0)
MCV: 94.9 fL (ref 80.0–100.0)
Platelets: 241 10*3/uL (ref 150–400)
Platelets: 236 10*3/uL (ref 150–400)
Platelets: 215 10*3/uL (ref 150–400)
Platelets: 193 10*3/uL (ref 150–400)
RBC: 5.08 MIL/uL (ref 4.22–5.81)
RBC: 4.85 MIL/uL (ref 4.22–5.81)
RBC: 4.66 MIL/uL (ref 4.22–5.81)
RBC: 4.29 MIL/uL (ref 4.22–5.81)
RDW: 11.5 % (ref 11.5–15.5)
RDW: 11.8 % (ref 11.5–15.5)
RDW: 11.8 % (ref 11.5–15.5)
RDW: 11.8 % (ref 11.5–15.5)
WBC: 10.6 10*3/uL — ABNORMAL HIGH (ref 4.0–10.5)
WBC: 21.2 10*3/uL — ABNORMAL HIGH (ref 4.0–10.5)
WBC: 17.1 10*3/uL — ABNORMAL HIGH (ref 4.0–10.5)
WBC: 13.4 10*3/uL — ABNORMAL HIGH (ref 4.0–10.5)
nRBC: 0 % (ref 0.0–0.2)
nRBC: 0 % (ref 0.0–0.2)
nRBC: 0 % (ref 0.0–0.2)
nRBC: 0 % (ref 0.0–0.2)

## 2020-04-29 LAB — RESPIRATORY PANEL BY RT PCR (FLU A&B, COVID)
Influenza A by PCR: NEGATIVE
Influenza B by PCR: NEGATIVE
SARS Coronavirus 2 by RT PCR: NEGATIVE

## 2020-04-29 LAB — COMPREHENSIVE METABOLIC PANEL
ALT: 248 U/L — ABNORMAL HIGH (ref 0–44)
AST: 188 U/L — ABNORMAL HIGH (ref 15–41)
Albumin: 4.4 g/dL (ref 3.5–5.0)
Alkaline Phosphatase: 108 U/L (ref 38–126)
Anion gap: 13 (ref 5–15)
BUN: 21 mg/dL — ABNORMAL HIGH (ref 6–20)
CO2: 20 mmol/L — ABNORMAL LOW (ref 22–32)
Calcium: 9 mg/dL (ref 8.9–10.3)
Chloride: 105 mmol/L (ref 98–111)
Creatinine, Ser: 0.99 mg/dL (ref 0.61–1.24)
GFR calc Af Amer: 53 mL/min — ABNORMAL LOW (ref >60)
GFR calc non Af Amer: 46 mL/min — ABNORMAL LOW (ref >60)
Glucose, Bld: 129 mg/dL — ABNORMAL HIGH (ref 70–99)
Potassium: 4 mmol/L (ref 3.5–5.1)
Sodium: 138 mmol/L (ref 135–145)
Total Bilirubin: 0.7 mg/dL (ref 0.3–1.2)
Total Protein: 7 g/dL (ref 6.5–8.1)

## 2020-04-29 LAB — SAMPLE TO BLOOD BANK

## 2020-04-29 LAB — ETHANOL: Alcohol, Ethyl (B): <10 mg/dL (ref <10)

## 2020-04-29 LAB — PROTIME-INR
INR: 1 (ref 0.8–1.2)
Prothrombin Time: 12.4 seconds (ref 11.4–15.2)

## 2020-04-29 LAB — HIV ANTIBODY (ROUTINE TESTING W REFLEX): HIV Screen 4th Generation wRfx: NONREACTIVE

## 2020-04-29 LAB — LACTIC ACID, PLASMA: Lactic Acid, Venous: 1.4 mmol/L (ref 0.5–1.9)

## 2020-04-29 MED ORDER — METOPROLOL TARTRATE 5 MG/5ML IV SOLN: 5.0000 mg | Freq: Four times a day (QID) | INTRAVENOUS | Status: DC | PRN

## 2020-04-29 MED ORDER — HYDROMORPHONE HCL 1 MG/ML IJ SOLN
0.5000 mg | Freq: Once | INTRAMUSCULAR | Status: AC
  Administered 2020-04-29: 0.5 mg via INTRAVENOUS
  Filled 2020-04-29: qty 1

## 2020-04-29 MED ORDER — ACETAMINOPHEN 500 MG PO TABS
1000.0000 mg | ORAL_TABLET | Freq: Three times a day (TID) | ORAL | Status: DC
  Administered 2020-04-29 – 2020-04-30 (×3): 1000 mg via ORAL
  Filled 2020-04-29 (×3): qty 2

## 2020-04-29 MED ORDER — ONDANSETRON HCL 4 MG/2ML IJ SOLN: 4.0000 mg | Freq: Four times a day (QID) | INTRAMUSCULAR | Status: DC | PRN

## 2020-04-29 MED ORDER — DOCUSATE SODIUM 100 MG PO CAPS
100.0000 mg | ORAL_CAPSULE | Freq: Two times a day (BID) | ORAL | Status: DC
  Administered 2020-04-29 – 2020-05-01 (×4): 100 mg via ORAL
  Filled 2020-04-29 (×4): qty 1

## 2020-04-29 MED ORDER — METHOCARBAMOL 500 MG PO TABS
500.0000 mg | ORAL_TABLET | Freq: Four times a day (QID) | ORAL | Status: DC
  Administered 2020-04-29 – 2020-04-30 (×4): 500 mg via ORAL
  Filled 2020-04-29 (×4): qty 1

## 2020-04-29 MED ORDER — ONDANSETRON HCL 4 MG/2ML IJ SOLN
INTRAMUSCULAR | Status: AC
  Administered 2020-04-29: 4 mg via INTRAVENOUS
  Filled 2020-04-29: qty 2

## 2020-04-29 MED ORDER — HYDROMORPHONE HCL 1 MG/ML IJ SOLN: 0.5000 mg | INTRAMUSCULAR | Status: DC | PRN

## 2020-04-29 MED ORDER — SODIUM CHLORIDE 0.9 % IV SOLN: INTRAVENOUS | Status: DC

## 2020-04-29 MED ORDER — IOHEXOL 300 MG/ML  SOLN
100.0000 mL | Freq: Once | INTRAMUSCULAR | Status: AC | PRN
  Administered 2020-04-29: 100 mL via INTRAVENOUS

## 2020-04-29 MED ORDER — ONDANSETRON 4 MG PO TBDP: 4.0000 mg | ORAL_TABLET | Freq: Four times a day (QID) | ORAL | Status: DC | PRN

## 2020-04-29 MED ORDER — OXYCODONE HCL 5 MG PO TABS
5.0000 mg | ORAL_TABLET | ORAL | Status: DC | PRN
  Administered 2020-04-29 – 2020-05-01 (×4): 10 mg via ORAL
  Filled 2020-04-29 (×4): qty 2

## 2020-04-29 MED ORDER — NICOTINE 21 MG/24HR TD PT24
21.0000 mg | MEDICATED_PATCH | Freq: Every day | TRANSDERMAL | Status: DC
  Administered 2020-04-29 – 2020-05-01 (×3): 21 mg via TRANSDERMAL
  Filled 2020-04-29 (×3): qty 1

## 2020-04-29 MED ORDER — HYDROMORPHONE HCL 1 MG/ML IJ SOLN
1.0000 mg | Freq: Once | INTRAMUSCULAR | Status: AC
  Administered 2020-04-29: 1 mg via INTRAVENOUS
  Filled 2020-04-29: qty 1

## 2020-04-29 MED ORDER — PANTOPRAZOLE SODIUM 40 MG IV SOLR
40.0000 mg | Freq: Every day | INTRAVENOUS | Status: DC
  Filled 2020-04-29: qty 40

## 2020-04-29 MED ORDER — PANTOPRAZOLE SODIUM 40 MG PO TBEC
40.0000 mg | DELAYED_RELEASE_TABLET | Freq: Every day | ORAL | Status: DC
  Administered 2020-04-29 – 2020-05-01 (×3): 40 mg via ORAL
  Filled 2020-04-29 (×3): qty 1

## 2020-04-29 MED ORDER — POLYETHYLENE GLYCOL 3350 17 G PO PACK
17.0000 g | PACK | Freq: Every day | ORAL | Status: DC
  Administered 2020-04-30 – 2020-05-01 (×2): 17 g via ORAL
  Filled 2020-04-29 (×2): qty 1

## 2020-04-29 NOTE — H&P (Addendum)
[facility] Admission Note  Seth Rios [birth year]  [id number].    Requesting MD: Benjiman Core Chief Complaint/Reason for Consult: chest wall injury  HPI:  Seth Rios is a 39yo male who was brought into [facility] as a level 2 trauma after being pinned by a palate on his right chest while at work. Reported flail chest per EMS therefore he was upgraded to a level 1. Upon arrival in the ED patient found to have stable vital signs, O2 sats 100% on a few liters O2 via [place]. Breath sounds present bilaterally. FAST exam negative. Complaining of right sided chest and abdominal pain. Worse with deep inspiration. Denies headache, neck pain, LOC, back pain, or pain in any extremity. Downgraded to level 2. Work up revealed grade 2-3 liver laceration, right rib fractures 6-9, RUQ mesenteric stranding without evidence of bowel injury. Trauma asked to see again for admission.  No significant PMH Anticoagulants: none Smokes 1 PPD Denies alcohol or illicit drug use Employment: works in receiving at warehouse  ROS: Review of Systems  Constitutional: Negative.   HENT: Negative.   Eyes: Negative.   Respiratory: Positive for shortness of breath.   Cardiovascular: Positive for chest pain.  Gastrointestinal: Positive for abdominal pain.  Genitourinary: Negative.   Musculoskeletal: Negative for back pain and neck pain.  Skin: Negative.   Neurological: Negative.  Negative for loss of consciousness.   All systems reviewed and otherwise negative except for as above  No family history on file.  No past medical history on file.  Social History:  has no history on file for tobacco use, alcohol use, and drug use.  Allergies: No Known Allergies  (Not in a hospital admission)   Prior to Admission medications   Medication Sig Start Date End Date Taking? Authorizing Provider  Phenylephrine-DM-GG-APAP (TYLENOL COLD/FLU SEVERE) 5-10-200-325 MG TABS Take 2 tablets by mouth every 6 (six) hours as  needed (cold symptoms).   Yes [provider]    Blood pressure 135/84, pulse 71, temperature (!) 97.3 F (36.3 C), temperature source Tympanic, resp. rate 12, height 5\' 9"  (1.753 m), weight 74.8 kg, SpO2 97 %. Physical Exam: General: pleasant, WD/WN male who is sitting up in bed in NAD HEENT: head is normocephalic, atraumatic.  Sclera are noninjected.  PERRL. c-collar in place.  Ears and nose without any masses or lesions.  Mouth is pink and moist. Dentition fair Heart: regular, rate, and rhythm.  Normal s1,s2. No obvious murmurs, gallops, or rubs noted.  Palpable pedal pulses bilaterally  Lungs: CTAB, no wheezes, rhonchi, or rales noted.  Respiratory effort nonlabored. Right lateral and anterior chest wall tenderness, no crepitance, no flail segment seent Abd:  soft, ND, +BS, no masses, hernias, or organomegaly. Abrasion RUQ with TTP, no peritonitis MS: no BUE/BLE edema, calves soft and nontender Skin: warm and dry with no masses, lesions, or rashes Psych: A&Ox4 with an appropriate affect Neuro: cranial nerves grossly intact, equal strength in BUE/BLE bilaterally, normal speech, thought process intact  Results for orders placed or performed during the hospital encounter of 04/29/20 (from the past 48 hour(s))  Sample to Blood Bank     Status: None (Preliminary result)   Collection Time: 04/29/20  8:20 AM  Result Value Ref Range   Blood Bank Specimen SAMPLE AVAILABLE FOR TESTING    Sample Expiration      04/30/2020,2359 Performed at [facility], [address] [place], [place]   Comprehensive metabolic panel     Status: Abnormal  Collection Time: 04/29/20  8:25 AM  Result Value Ref Range   Sodium 138 135 - 145 mmol/L   Potassium 4.0 3.5 - 5.1 mmol/L   Chloride 105 98 - 111 mmol/L   CO2 20 (L) 22 - 32 mmol/L   Glucose, Bld 129 (H) 70 - 99 mg/dL    Comment: Glucose reference range applies only to samples taken after fasting for at least 8 hours.   BUN 21  (H) 6 - 20 mg/dL    Comment: QA FLAGS AND/OR RANGES MODIFIED BY DEMOGRAPHIC UPDATE ON 09/24 AT 0947   Creatinine, Ser 0.99 0.61 - 1.24 mg/dL   Calcium 9.0 8.9 - 16.110.3 mg/dL   Total Protein 7.0 6.5 - 8.1 g/dL   Albumin 4.4 3.5 - 5.0 g/dL   AST 096188 (H) 15 - 41 U/L   ALT 248 (H) 0 - 44 U/L   Alkaline Phosphatase 108 38 - 126 U/L   Total Bilirubin 0.7 0.3 - 1.2 mg/dL   GFR calc non Af Amer 46 (L) >60 mL/min   GFR calc Af Amer 53 (L) >60 mL/min   Anion gap 13 5 - 15    Comment: Performed at [facility], [address] [place], [place] [address]  CBC     Status: Abnormal   Collection Time: 04/29/20  8:25 AM  Result Value Ref Range   WBC 10.6 (H) 4.0 - 10.5 K/uL   RBC 5.08 4.22 - 5.81 MIL/uL   Hemoglobin 16.3 13.0 - 17.0 g/dL   HCT 98.149.2 39 - 52 %   MCV 96.9 80.0 - 100.0 fL   MCH 32.1 26.0 - 34.0 pg   MCHC 33.1 30.0 - 36.0 g/dL   RDW 19.111.5 47.811.5 - 29.515.5 %   Platelets 241 150 - 400 K/uL   nRBC 0.0 0.0 - 0.2 %    Comment: Performed at [facility], [address] [place], [place] [address]  Ethanol     Status: None   Collection Time: 04/29/20  8:25 AM  Result Value Ref Range   Alcohol, Ethyl (B) <10 <10 mg/dL    Comment: (NOTE) Lowest detectable limit for serum alcohol is 10 mg/dL.  For medical purposes only. Performed at [facility], [address] [place], [place] [address]   Protime-INR     Status: None   Collection Time: 04/29/20  8:25 AM  Result Value Ref Range   Prothrombin Time 12.4 11.4 - 15.2 seconds   INR 1.0 0.8 - 1.2    Comment: (NOTE) INR goal varies based on device and disease states. Performed at [facility], [address] [place], [place] [address]   I-Stat Chem 8, ED     Status: Abnormal   Collection Time: 04/29/20  8:28 AM  Result Value Ref Range   Sodium 138 135 - 145 mmol/L   Potassium 3.9 3.5 - 5.1 mmol/L   Chloride 104 98 - 111 mmol/L   BUN 25 (H) 6 - 20 mg/dL    Comment: QA FLAGS AND/OR RANGES MODIFIED BY DEMOGRAPHIC  UPDATE ON 09/24 AT 0947   Creatinine, Ser 1.00 0.61 - 1.24 mg/dL   Glucose, Bld 952127 (H) 70 - 99 mg/dL    Comment: Glucose reference range applies only to samples taken after fasting for at least 8 hours.   Calcium, Ion 1.06 (L) 1.15 - 1.40 mmol/L   TCO2 22 22 - 32 mmol/L   Hemoglobin 16.3 13.0 - 17.0 g/dL  HCT 48.0 39 - 52 %  Lactic acid, plasma     Status: None   Collection Time: 04/29/20  8:37 AM  Result Value Ref Range   Lactic Acid, Venous 1.4 0.5 - 1.9 mmol/L    Comment: Performed at [facility], [address], [place], [place] [address]  Urinalysis, Routine w reflex microscopic Nasopharyngeal Swab     Status: None   Collection Time: 04/29/20  9:39 AM  Result Value Ref Range   Color, Urine YELLOW YELLOW   APPearance CLEAR CLEAR   Specific Gravity, Urine 1.010 1.005 - 1.030   pH 5.5 5.0 - 8.0   Glucose, UA NEGATIVE NEGATIVE mg/dL   Hgb urine dipstick NEGATIVE NEGATIVE   Bilirubin Urine NEGATIVE NEGATIVE   Ketones, ur NEGATIVE NEGATIVE mg/dL   Protein, ur NEGATIVE NEGATIVE mg/dL   Nitrite NEGATIVE NEGATIVE   Leukocytes,Ua NEGATIVE NEGATIVE    Comment: Microscopic not done on urines with negative protein, blood, leukocytes, nitrite, or glucose < 500 mg/dL. Performed at [facility], [address] [place], [place] [address]    CT Chest W Contrast  Result Date: 04/29/2020 CLINICAL DATA:  Abdominal trauma. CT CAP with contrast due to trauma Arrived to ED via [facility]; Reported patient was moving pallets at a high rise location when the pallets securement device snapped and he was pinned against the pallets and wall. EXAM: CT CHEST, ABDOMEN, AND PELVIS WITH CONTRAST TECHNIQUE: Multidetector CT imaging of the chest, abdomen and pelvis was performed following the standard protocol during bolus administration of intravenous contrast. CONTRAST:  OMNIPAQUE IOHEXOL 300 MG/ML  SOLN COMPARISON:  None FINDINGS: CT CHEST FINDINGS Cardiovascular: No significant  vascular findings. Normal heart size. No pericardial effusion. Mediastinum/Nodes: The visualized portion of the thyroid gland has a normal appearance. The office is normal in appearance. No mediastinal or hilar adenopathy. Axillary regions are unremarkable. Lungs/Pleura: No pneumothorax. There are no pleural effusions or focal consolidations/contusions. Throughout all lobes, there are scattered tiny solid and lucent centered lesions, largest measuring 5 millimeters. RIGHT UPPER lobe lesions are best seen on images 23, 34, 43, 45, 79 of series 5. Similar nodules are identified in all lobes. Musculoskeletal: There are acute fractures of the RIGHT ribs 6-9. The sternum is intact. No vertebral fracture. CT ABDOMEN PELVIS FINDINGS Hepatobiliary: There is an acute laceration of the posterior segment of the RIGHT hepatic lobe associated with subcapsular hematoma. Laceration measures 5.4 centimeters on image 58 of series 3. Subcapsular hematoma measures 6.7 x 2.0 centimeters on image 62 of series 3. No liver lesions identified. Gallbladder is distended and contains numerous calcified stones. Pancreas: Unremarkable. No pancreatic ductal dilatation or surrounding inflammatory changes. Spleen: Normal in size without focal abnormality. Adrenals/Urinary Tract: Adrenal glands are unremarkable. Kidneys are normal, without renal calculi, focal lesion, or hydronephrosis. Bladder is unremarkable. Stomach/Bowel: Stomach and small bowel loops are normal in appearance. There is a small amount of mesenteric stranding in the RIGHT UPPER QUADRANT adjacent to the hepatic flexure. No focal wall thickening colon is unremarkable. The appendix is well seen and has a normal appearance. Vascular/Lymphatic: No significant vascular findings are present. No enlarged abdominal or pelvic lymph nodes. Reproductive: Prostate is unremarkable. Other: No ascites.  Anterior abdominal wall is unremarkable. Musculoskeletal: No acute fractures of the pelvis or  lumbar spine. IMPRESSION: 1. Acute grade 3 laceration of the posterior segment of the RIGHT hepatic lobe associated with subcapsular hematoma. 2. Acute fractures of the RIGHT ribs 6-9. 3. Small  amount of mesenteric stranding in the RIGHT UPPER QUADRANT adjacent to the hepatic flexure of the colon, consistent with mesenteric injury. No evidence for bowel injury. 4. Numerous tiny solid and lucent centered lesions throughout the lungs, measuring up to 5 millimeters. Differential considerations include infectious, inflammatory, or neoplastic process. Recommend pulmonary consultation and follow-up chest CT in 3 months. 5. Distended gallbladder and cholelithiasis. These results were called by telephone at the time of interpretation on 04/29/2020 at 9:32 am to provider [facility] , who verbally acknowledged these results. Electronically Signed   By: Norva Pavlov M.D.   On: 04/29/2020 09:33   CT ABDOMEN PELVIS W CONTRAST  Result Date: 04/29/2020 CLINICAL DATA:  Abdominal trauma. CT CAP with contrast due to trauma Arrived to ED [facility]; Reported patient was moving pallets at a high rise location when the pallets securement device snapped and he was pinned against the pallets and wall. EXAM: CT CHEST, ABDOMEN, AND PELVIS WITH CONTRAST TECHNIQUE: Multidetector CT imaging of the chest, abdomen and pelvis was performed following the standard protocol during bolus administration of intravenous contrast. CONTRAST:  OMNIPAQUE IOHEXOL 300 MG/ML  SOLN COMPARISON:  None FINDINGS: CT CHEST FINDINGS Cardiovascular: No significant vascular findings. Normal heart size. No pericardial effusion. Mediastinum/Nodes: The visualized portion of the thyroid gland has a normal appearance. The office is normal in appearance. No mediastinal or hilar adenopathy. Axillary regions are unremarkable. Lungs/Pleura: No pneumothorax. There are no pleural effusions or focal consolidations/contusions. Throughout all lobes, there  are scattered tiny solid and lucent centered lesions, largest measuring 5 millimeters. RIGHT UPPER lobe lesions are best seen on images 23, 34, 43, 45, 79 of series 5. Similar nodules are identified in all lobes. Musculoskeletal: There are acute fractures of the RIGHT ribs 6-9. The sternum is intact. No vertebral fracture. CT ABDOMEN PELVIS FINDINGS Hepatobiliary: There is an acute laceration of the posterior segment of the RIGHT hepatic lobe associated with subcapsular hematoma. Laceration measures 5.4 centimeters on image 58 of series 3. Subcapsular hematoma measures 6.7 x 2.0 centimeters on image 62 of series 3. No liver lesions identified. Gallbladder is distended and contains numerous calcified stones. Pancreas: Unremarkable. No pancreatic ductal dilatation or surrounding inflammatory changes. Spleen: Normal in size without focal abnormality. Adrenals/Urinary Tract: Adrenal glands are unremarkable. Kidneys are normal, without renal calculi, focal lesion, or hydronephrosis. Bladder is unremarkable. Stomach/Bowel: Stomach and small bowel loops are normal in appearance. There is a small amount of mesenteric stranding in the RIGHT UPPER QUADRANT adjacent to the hepatic flexure. No focal wall thickening colon is unremarkable. The appendix is well seen and has a normal appearance. Vascular/Lymphatic: No significant vascular findings are present. No enlarged abdominal or pelvic lymph nodes. Reproductive: Prostate is unremarkable. Other: No ascites.  Anterior abdominal wall is unremarkable. Musculoskeletal: No acute fractures of the pelvis or lumbar spine. IMPRESSION: 1. Acute grade 3 laceration of the posterior segment of the RIGHT hepatic lobe associated with subcapsular hematoma. 2. Acute fractures of the RIGHT ribs 6-9. 3. Small amount of mesenteric stranding in the RIGHT UPPER QUADRANT adjacent to the hepatic flexure of the colon, consistent with mesenteric injury. No evidence for bowel injury. 4. Numerous tiny  solid and lucent centered lesions throughout the lungs, measuring up to 5 millimeters. Differential considerations include infectious, inflammatory, or neoplastic process. Recommend pulmonary consultation and follow-up chest CT in 3 months. 5. Distended gallbladder and cholelithiasis. These results were called by telephone at the time of interpretation on 04/29/2020 at 9:32 am to  provider Benjiman Core , who verbally acknowledged these results. Electronically Signed   By: Norva Pavlov M.D.   On: 04/29/2020 09:33   DG Pelvis Portable  Result Date: 04/29/2020 CLINICAL DATA:  Trauma, pain EXAM: PORTABLE PELVIS 1-2 VIEWS COMPARISON:  None. FINDINGS: There is no evidence of pelvic fracture or diastasis. No pelvic bone lesions are seen. IMPRESSION: Negative. Electronically Signed   By: Judie Petit.  Shick M.D.   On: 04/29/2020 08:36   DG Chest Port 1 View  Result Date: 04/29/2020 CLINICAL DATA:  Trauma.  Right chest pain. EXAM: PORTABLE CHEST 1 VIEW COMPARISON:  None. FINDINGS: The heart size and mediastinal contours are within normal limits. No consolidation. No discernible pneumothorax. No pleural effusions. Biapical pleuroparenchymal scarring. Acute mildly displaced right lateral sixth rib fracture. IMPRESSION: 1. Acute mildly displaced right lateral sixth rib fracture. 2. No evidence of acute cardiopulmonary abnormality.No discernible pneumothorax. Electronically Signed   By: Feliberto Harts MD   On: 04/29/2020 08:39      Assessment/Plan Right chest pinned by palate Grade 2-3 liver lac - grade 2 per trauma standards. Serial CBCs, ok to mobilize R rib fxs 6-9 - multimodal pain control and pulm toilet, repeat CXR in AM RUQ mesenteric stranding -  no evidence of bowel injury on CT. Monitor abdominal exams, ok for CLD Elevated LFTs - likely 2/2 liver lac, repeat CMP in AM Tobacco abuse - nicotine patch Lung lesions - will need follow up CT chest in 3 months  ID - none VTE - SCDs only FEN - IVF,  CLD Foley - none Follow up - TBD  Plan - Admit to inpatient, med-surg level. PT/OT. Covid test is pending.  Franne Forts, PA-C [facility] Surgery 04/29/2020, 11:36 AM Please see Amion for pager number during day hours 7:00am-4:30pm

## 2020-04-29 NOTE — ED Triage Notes (Addendum)
Arrived to ED via [facility]; Reported patient was moving pellets at a high rise location. Patient stated he was operating the crane at either 3rd or 4th level  when the pellets securement device got stuck while the crane continue to move him forward, and he was pinned against the pellets. C/O right sided chest wall pain; no penetration; + abrasion to affected side; VSS. Patient awake, alert x4; denies head injury and LOC.

## 2020-04-29 NOTE — ED Provider Notes (Signed)
[facility] Provider Note   CSN: [id number] Arrival date & time: 04/29/20  0818     History Chief Complaint  Patient presents with  . Chest Wall Injury    Seth Rios is a 39 y.o. male.  HPI Patient presents after being pinned by a palate on his right chest.  Given his level 1 trauma due to reported flail chest by EMS.  Met by myself and trauma surgery in the trauma room.  Vitals reassuring.  Patient states he has pain in his right chest and right abdomen.  No neck pain.  Was not hit in the head.  States he did feel little lightheaded when it happened but that cleared up quickly.  Otherwise healthy.  Has had his Covid vaccines.    Past Medical History:  Diagnosis Date  . Asthma    childhood asthma  . Liver laceration 04/29/2020    Patient Active Problem List   Diagnosis Date Noted  . Liver laceration 04/29/2020         History reviewed. No pertinent family history.  Social History   Tobacco Use  . Smoking status: Current Every Day Smoker    Packs/day: 1.00    Years: 20.00    Pack years: 20.00    Types: Cigarettes  . Smokeless tobacco: Never Used  Vaping Use  . Vaping Use: Never used  Substance Use Topics  . Alcohol use: Yes    Comment: rare  . Drug use: Yes    Types: Marijuana    Home Medications Prior to Admission medications   Medication Sig Start Date End Date Taking? Authorizing Provider  Phenylephrine-DM-GG-APAP (TYLENOL COLD/FLU SEVERE) 5-10-200-325 MG TABS Take 2 tablets by mouth every 6 (six) hours as needed (cold symptoms).   Yes [provider]    Allergies    Patient has no known allergies.  Review of Systems   Review of Systems  Constitutional: Negative for appetite change.  HENT: Negative for congestion.   Cardiovascular: Positive for chest pain.  Gastrointestinal: Positive for abdominal pain.  Musculoskeletal: Negative for neck pain.  Skin: Negative for pallor.  Neurological:  Negative for weakness.  Psychiatric/Behavioral: Negative for confusion.    Physical Exam Updated Vital Signs BP 128/86   Pulse 67   Temp 97.8 F (36.6 C) (Oral)   Resp 12   Ht _0  (1.753 m)   Wt 74.8 kg   SpO2 96%   BMI 24.37 kg/m   Physical Exam Vitals and nursing note reviewed.  HENT:     Head: Normocephalic.  Eyes:     Extraocular Movements: Extraocular movements intact.     Pupils: Pupils are equal, round, and reactive to light.  Cardiovascular:     Rate and Rhythm: Regular rhythm.  Pulmonary:     Comments: Tenderness to right lateral and anterior lower chest wall.  Has some abrasion in the area.  No subcu emphysema.  No crepitance.  No flail segment seen. Chest:     Chest wall: Tenderness present.  Abdominal:     Tenderness: There is abdominal tenderness.     Comments: Right upper quadrant tenderness with abrasion.  No mass palpated.  Musculoskeletal:        General: Tenderness present.     Cervical back: Neck supple.     Right lower leg: No edema.     Left lower leg: No edema.     Comments: Chest wall tenderness.  No cervical spine tenderness.  Skin:  General: Skin is warm.     Capillary Refill: Capillary refill takes less than 2 seconds.  Neurological:     Mental Status: He is alert and oriented to person, place, and time.     ED Results / Procedures / Treatments   Labs (all labs ordered are listed, but only abnormal results are displayed) Labs Reviewed  COMPREHENSIVE METABOLIC PANEL - Abnormal; Notable for the following components:      Result Value   CO2 20 (*)    Glucose, Bld 129 (*)    BUN 21 (*)    AST 188 (*)    ALT 248 (*)    GFR calc non Af Amer 46 (*)    GFR calc Af Amer 53 (*)    All other components within normal limits  CBC - Abnormal; Notable for the following components:   WBC 10.6 (*)    All other components within normal limits  CBC - Abnormal; Notable for the following components:   WBC 21.2 (*)    All other components  within normal limits  I-STAT CHEM 8, ED - Abnormal; Notable for the following components:   BUN 25 (*)    Glucose, Bld 127 (*)    Calcium, Ion 1.06 (*)    All other components within normal limits  RESPIRATORY PANEL BY RT PCR (FLU A&B, COVID)  ETHANOL  URINALYSIS, ROUTINE W REFLEX MICROSCOPIC  LACTIC ACID, PLASMA  PROTIME-INR  HIV ANTIBODY (ROUTINE TESTING W REFLEX)  CBC  CBC  SAMPLE TO BLOOD BANK    EKG None  Radiology CT Chest W Contrast  Result Date: 04/29/2020 CLINICAL DATA:  Abdominal trauma. CT CAP with contrast due to trauma Arrived to ED via [facility]; Reported patient was moving pallets at a high rise location when the pallets securement device snapped and he was pinned against the pallets and wall. EXAM: CT CHEST, ABDOMEN, AND PELVIS WITH CONTRAST TECHNIQUE: Multidetector CT imaging of the chest, abdomen and pelvis was performed following the standard protocol during bolus administration of intravenous contrast. CONTRAST:  189mL OMNIPAQUE IOHEXOL 300 MG/ML  SOLN COMPARISON:  None FINDINGS: CT CHEST FINDINGS Cardiovascular: No significant vascular findings. Normal heart size. No pericardial effusion. Mediastinum/Nodes: The visualized portion of the thyroid gland has a normal appearance. The office is normal in appearance. No mediastinal or hilar adenopathy. Axillary regions are unremarkable. Lungs/Pleura: No pneumothorax. There are no pleural effusions or focal consolidations/contusions. Throughout all lobes, there are scattered tiny solid and lucent centered lesions, largest measuring 5 millimeters. RIGHT UPPER lobe lesions are best seen on images 23, 34, 43, 45, 79 of series 5. Similar nodules are identified in all lobes. Musculoskeletal: There are acute fractures of the RIGHT ribs 6-9. The sternum is intact. No vertebral fracture. CT ABDOMEN PELVIS FINDINGS Hepatobiliary: There is an acute laceration of the posterior segment of the RIGHT hepatic lobe associated with  subcapsular hematoma. Laceration measures 5.4 centimeters on image 58 of series 3. Subcapsular hematoma measures 6.7 x 2.0 centimeters on image 62 of series 3. No liver lesions identified. Gallbladder is distended and contains numerous calcified stones. Pancreas: Unremarkable. No pancreatic ductal dilatation or surrounding inflammatory changes. Spleen: Normal in size without focal abnormality. Adrenals/Urinary Tract: Adrenal glands are unremarkable. Kidneys are normal, without renal calculi, focal lesion, or hydronephrosis. Bladder is unremarkable. Stomach/Bowel: Stomach and small bowel loops are normal in appearance. There is a small amount of mesenteric stranding in the RIGHT UPPER QUADRANT adjacent to the hepatic flexure. No focal wall thickening  colon is unremarkable. The appendix is well seen and has a normal appearance. Vascular/Lymphatic: No significant vascular findings are present. No enlarged abdominal or pelvic lymph nodes. Reproductive: Prostate is unremarkable. Other: No ascites.  Anterior abdominal wall is unremarkable. Musculoskeletal: No acute fractures of the pelvis or lumbar spine. IMPRESSION: 1. Acute grade 3 laceration of the posterior segment of the RIGHT hepatic lobe associated with subcapsular hematoma. 2. Acute fractures of the RIGHT ribs 6-9. 3. Small amount of mesenteric stranding in the RIGHT UPPER QUADRANT adjacent to the hepatic flexure of the colon, consistent with mesenteric injury. No evidence for bowel injury. 4. Numerous tiny solid and lucent centered lesions throughout the lungs, measuring up to 5 millimeters. Differential considerations include infectious, inflammatory, or neoplastic process. Recommend pulmonary consultation and follow-up chest CT in 3 months. 5. Distended gallbladder and cholelithiasis. These results were called by telephone at the time of interpretation on 04/29/2020 at 9:32 am to provider [facility] , who verbally acknowledged these results. Electronically  Signed   By: Nolon Nations M.D.   On: 04/29/2020 09:33   CT ABDOMEN PELVIS W CONTRAST  Result Date: 04/29/2020 CLINICAL DATA:  Abdominal trauma. CT CAP with contrast due to trauma Arrived to ED via [facility]; Reported patient was moving pallets at a high rise location when the pallets securement device snapped and he was pinned against the pallets and wall. EXAM: CT CHEST, ABDOMEN, AND PELVIS WITH CONTRAST TECHNIQUE: Multidetector CT imaging of the chest, abdomen and pelvis was performed following the standard protocol during bolus administration of intravenous contrast. CONTRAST:  159m OMNIPAQUE IOHEXOL 300 MG/ML  SOLN COMPARISON:  None FINDINGS: CT CHEST FINDINGS Cardiovascular: No significant vascular findings. Normal heart size. No pericardial effusion. Mediastinum/Nodes: The visualized portion of the thyroid gland has a normal appearance. The office is normal in appearance. No mediastinal or hilar adenopathy. Axillary regions are unremarkable. Lungs/Pleura: No pneumothorax. There are no pleural effusions or focal consolidations/contusions. Throughout all lobes, there are scattered tiny solid and lucent centered lesions, largest measuring 5 millimeters. RIGHT UPPER lobe lesions are best seen on images 23, 34, 43, 45, 79 of series 5. Similar nodules are identified in all lobes. Musculoskeletal: There are acute fractures of the RIGHT ribs 6-9. The sternum is intact. No vertebral fracture. CT ABDOMEN PELVIS FINDINGS Hepatobiliary: There is an acute laceration of the posterior segment of the RIGHT hepatic lobe associated with subcapsular hematoma. Laceration measures 5.4 centimeters on image 58 of series 3. Subcapsular hematoma measures 6.7 x 2.0 centimeters on image 62 of series 3. No liver lesions identified. Gallbladder is distended and contains numerous calcified stones. Pancreas: Unremarkable. No pancreatic ductal dilatation or surrounding inflammatory changes. Spleen: Normal in size without focal  abnormality. Adrenals/Urinary Tract: Adrenal glands are unremarkable. Kidneys are normal, without renal calculi, focal lesion, or hydronephrosis. Bladder is unremarkable. Stomach/Bowel: Stomach and small bowel loops are normal in appearance. There is a small amount of mesenteric stranding in the RIGHT UPPER QUADRANT adjacent to the hepatic flexure. No focal wall thickening colon is unremarkable. The appendix is well seen and has a normal appearance. Vascular/Lymphatic: No significant vascular findings are present. No enlarged abdominal or pelvic lymph nodes. Reproductive: Prostate is unremarkable. Other: No ascites.  Anterior abdominal wall is unremarkable. Musculoskeletal: No acute fractures of the pelvis or lumbar spine. IMPRESSION: 1. Acute grade 3 laceration of the posterior segment of the RIGHT hepatic lobe associated with subcapsular hematoma. 2. Acute fractures of the RIGHT ribs 6-9. 3. Small amount of mesenteric  stranding in the RIGHT UPPER QUADRANT adjacent to the hepatic flexure of the colon, consistent with mesenteric injury. No evidence for bowel injury. 4. Numerous tiny solid and lucent centered lesions throughout the lungs, measuring up to 5 millimeters. Differential considerations include infectious, inflammatory, or neoplastic process. Recommend pulmonary consultation and follow-up chest CT in 3 months. 5. Distended gallbladder and cholelithiasis. These results were called by telephone at the time of interpretation on 04/29/2020 at 9:32 am to provider [facility] , who verbally acknowledged these results. Electronically Signed   By: Nolon Nations M.D.   On: 04/29/2020 09:33   DG Pelvis Portable  Result Date: 04/29/2020 CLINICAL DATA:  Trauma, pain EXAM: PORTABLE PELVIS 1-2 VIEWS COMPARISON:  None. FINDINGS: There is no evidence of pelvic fracture or diastasis. No pelvic bone lesions are seen. IMPRESSION: Negative. Electronically Signed   By: Jerilynn Mages.  Shick M.D.   On: 04/29/2020 08:36   DG  Chest Port 1 View  Result Date: 04/29/2020 CLINICAL DATA:  Trauma.  Right chest pain. EXAM: PORTABLE CHEST 1 VIEW COMPARISON:  None. FINDINGS: The heart size and mediastinal contours are within normal limits. No consolidation. No discernible pneumothorax. No pleural effusions. Biapical pleuroparenchymal scarring. Acute mildly displaced right lateral sixth rib fracture. IMPRESSION: 1. Acute mildly displaced right lateral sixth rib fracture. 2. No evidence of acute cardiopulmonary abnormality.No discernible pneumothorax. Electronically Signed   By: Margaretha Sheffield MD   On: 04/29/2020 08:39    Procedures Procedures (including critical care time)  Medications Ordered in ED Medications  metoprolol tartrate (LOPRESSOR) injection 5 mg (has no administration in time range)  pantoprazole (PROTONIX) EC tablet 40 mg (40 mg Oral Given 04/29/20 1344)    Or  pantoprazole (PROTONIX) injection 40 mg ( Intravenous See Alternative 04/29/20 1344)  ondansetron (ZOFRAN-ODT) disintegrating tablet 4 mg ( Oral See Alternative 04/29/20 1350)    Or  ondansetron (ZOFRAN) injection 4 mg (4 mg Intravenous Given 04/29/20 1350)  docusate sodium (COLACE) capsule 100 mg (100 mg Oral Refused 04/29/20 1351)  polyethylene glycol (MIRALAX / GLYCOLAX) packet 17 g (17 g Oral Refused 04/29/20 1350)  HYDROmorphone (DILAUDID) injection 0.5-1 mg (has no administration in time range)  oxyCODONE (Oxy IR/ROXICODONE) immediate release tablet 5-10 mg (10 mg Oral Given 04/29/20 1344)  acetaminophen (TYLENOL) tablet 1,000 mg (1,000 mg Oral Given 04/29/20 1445)  methocarbamol (ROBAXIN) tablet 500 mg (500 mg Oral Given 04/29/20 1344)  0.9 %  sodium chloride infusion ( Intravenous New Bag/Given 04/29/20 1447)  nicotine (NICODERM CQ - dosed in mg/24 hours) patch 21 mg (21 mg Transdermal Patch Applied 04/29/20 1344)  iohexol (OMNIPAQUE) 300 MG/ML solution 100 mL (100 mLs Intravenous Contrast Given 04/29/20 0854)  HYDROmorphone (DILAUDID) injection 0.5 mg  (0.5 mg Intravenous Given 04/29/20 0928)  HYDROmorphone (DILAUDID) injection 1 mg (1 mg Intravenous Given 04/29/20 1000)    ED Course  I have reviewed the triage vital signs and the nursing notes.  Pertinent labs & imaging results that were available during my care of the patient were reviewed by me and considered in my medical decision making (see chart for details).    MDM Rules/Calculators/A&P                          Patient in trauma.  Hit by palate.  Initial level 1 trauma decreased after no flail segment seen on exam.  However met in ER by trauma surgery.  Vitals reassuring.  CT scan showed hepatic injury and rib  fractures.  Admit to trauma surgery.  Has grade 2-3 liver laceration.  However it appears to be subcapsular.  CRITICAL CARE Performed by: Davonna Belling Total critical care time: 30 minutes Critical care time was exclusive of separately billable procedures and treating other patients. Critical care was necessary to treat or prevent imminent or life-threatening deterioration. Critical care was time spent personally by me on the following activities: development of treatment plan with patient and/or surrogate as well as nursing, discussions with consultants, evaluation of patient's response to treatment, examination of patient, obtaining history from patient or surrogate, ordering and performing treatments and interventions, ordering and review of laboratory studies, ordering and review of radiographic studies, pulse oximetry and re-evaluation of patient's condition.  Final Clinical Impression(s) / ED Diagnoses Final diagnoses:  Trauma  Liver laceration, grade III, without open wound into cavity, initial encounter  Closed fracture of multiple ribs of right side, initial encounter    Rx / DC Orders ED Discharge Orders    None       Davonna Belling, MD 04/29/20 1507

## 2020-04-29 NOTE — ED Notes (Signed)
Patient transported to CT 

## 2020-04-29 NOTE — ED Provider Notes (Signed)
  ED Course/Procedures     Ultrasound ED FAST  Date/Time: 04/29/2020 8:39 AM Performed by: Liberty Handy, PA-C Authorized by: Liberty Handy, PA-C  Procedure details:    Indications: blunt chest trauma      Assess for:  Intra-abdominal fluid, pericardial effusion and pneumothorax    Technique:  Abdominal, cardiac and chest    Images: archived    Study Limitations: patient compliance (pain)  Abdominal findings:    L kidney:  Visualized   R kidney:  Visualized   Liver:  Visualized   Bladder:  Visualized,    Hepatorenal space visualized: identified     Splenorenal space: identified     Rectovesical free fluid: not identified     Splenorenal free fluid: not identified     Hepatorenal space free fluid: not identified   Cardiac findings:    Heart:  Visualized   Wall motion: identified     Pericardial effusion: not identified   Chest findings:    L lung sliding: identified     R lung sliding: identified     Fluid in thorax: not identified       Liberty Handy, PA-C 04/29/20 0840    Benjiman Core, MD 04/29/20 1500

## 2020-04-30 ENCOUNTER — Inpatient Hospital Stay (HOSPITAL_COMMUNITY): Payer: PRIVATE HEALTH INSURANCE

## 2020-04-30 LAB — COMPREHENSIVE METABOLIC PANEL
ALT: 198 U/L — ABNORMAL HIGH (ref 0–44)
AST: 96 U/L — ABNORMAL HIGH (ref 15–41)
Albumin: 3.6 g/dL (ref 3.5–5.0)
Alkaline Phosphatase: 86 U/L (ref 38–126)
Anion gap: 10 (ref 5–15)
BUN: 14 mg/dL (ref 6–20)
CO2: 24 mmol/L (ref 22–32)
Calcium: 8.5 mg/dL — ABNORMAL LOW (ref 8.9–10.3)
Chloride: 103 mmol/L (ref 98–111)
Creatinine, Ser: 0.76 mg/dL (ref 0.61–1.24)
GFR calc Af Amer: >60 mL/min (ref >60)
GFR calc non Af Amer: >60 mL/min (ref >60)
Glucose, Bld: 110 mg/dL — ABNORMAL HIGH (ref 70–99)
Potassium: 3.6 mmol/L (ref 3.5–5.1)
Sodium: 137 mmol/L (ref 135–145)
Total Bilirubin: 1.3 mg/dL — ABNORMAL HIGH (ref 0.3–1.2)
Total Protein: 6.1 g/dL — ABNORMAL LOW (ref 6.5–8.1)

## 2020-04-30 LAB — CBC
HCT: 41 % (ref 39.0–52.0)
Hemoglobin: 14.1 g/dL (ref 13.0–17.0)
MCH: 32.9 pg (ref 26.0–34.0)
MCHC: 34.4 g/dL (ref 30.0–36.0)
MCV: 95.8 fL (ref 80.0–100.0)
Platelets: 189 10*3/uL (ref 150–400)
RBC: 4.28 MIL/uL (ref 4.22–5.81)
RDW: 12 % (ref 11.5–15.5)
WBC: 10.8 10*3/uL — ABNORMAL HIGH (ref 4.0–10.5)
nRBC: 0 % (ref 0.0–0.2)

## 2020-04-30 MED ORDER — METHOCARBAMOL 750 MG PO TABS
750.0000 mg | ORAL_TABLET | Freq: Four times a day (QID) | ORAL | Status: DC
  Administered 2020-04-30 – 2020-05-01 (×5): 750 mg via ORAL
  Filled 2020-04-30 (×5): qty 1

## 2020-04-30 MED ORDER — ACETAMINOPHEN 500 MG PO TABS: 1000.0000 mg | ORAL_TABLET | Freq: Four times a day (QID) | ORAL | Status: DC

## 2020-04-30 MED ORDER — ACETAMINOPHEN 325 MG PO TABS
650.0000 mg | ORAL_TABLET | Freq: Four times a day (QID) | ORAL | Status: DC
  Administered 2020-04-30 (×2): 650 mg via ORAL
  Filled 2020-04-30 (×3): qty 2

## 2020-04-30 MED ORDER — LIDOCAINE 5 % EX PTCH
1.0000 | MEDICATED_PATCH | CUTANEOUS | Status: DC
  Administered 2020-04-30 – 2020-05-01 (×2): 1 via TRANSDERMAL
  Filled 2020-04-30 (×2): qty 1

## 2020-04-30 MED ORDER — IBUPROFEN 600 MG PO TABS
600.0000 mg | ORAL_TABLET | Freq: Three times a day (TID) | ORAL | Status: DC
  Administered 2020-04-30 – 2020-05-01 (×4): 600 mg via ORAL
  Filled 2020-04-30 (×4): qty 1

## 2020-04-30 NOTE — Progress Notes (Signed)
Occupational Therapy Evaluation Patient Details Name: Seth Rios MRN: [id number] DOB: 1980-12-16 Today's Date: 04/30/2020    History of Present Illness 39 y.o. male admitted with grade 2-3 liver lac, R rib fxs 6-9, RUE mesenteric stranding and lung lesions after being pinned by a palate at work. No significant PMHx.    Clinical Impression   PTA patient was independent with BADLs/IADLs and was working full-time at [employer]. Patient currently presents near baseline level of function demonstrating Mod I to independence with BADLs and BADL transfers without use of AD. Patient does not currently require occupational therapy services with OT to sign off at this time. Recommendation for d/c home with wife who is able to provide assist with IADLs.     Follow Up Recommendations  No OT follow up;Supervision - Intermittent    Equipment Recommendations  None recommended by OT    Recommendations for Other Services       Precautions / Restrictions Precautions Precautions: None Restrictions Weight Bearing Restrictions: No      Mobility Bed Mobility Overal bed mobility: Independent             General bed mobility comments: Patient seated in recliner upon entry.   Transfers Overall transfer level: Independent Equipment used: None             General transfer comment: Patient demonstrates sit to stand from various surfaces with independence.     Balance Overall balance assessment: Independent                                         ADL either performed or assessed with clinical judgement   ADL Overall ADL's : At baseline                                       General ADL Comments: Patient demonstrates toilet transfer, hand washing standing at sink level, and LB dressing to doff/don socks with Mod I and increased time 2/2 L flank pain.      Vision Baseline Vision/History: Wears glasses Wears Glasses: At all times Patient Visual  Report: No change from baseline Vision Assessment?: No apparent visual deficits     Perception Perception Perception Tested?: No   Praxis Praxis Praxis tested?: Not tested    Pertinent Vitals/Pain Pain Assessment: 0-10 Pain Score: 6  Pain Location: right ribs  Pain Descriptors / Indicators: Aching Pain Intervention(s): Monitored during session;Repositioned     Hand Dominance Right   Extremity/Trunk Assessment Upper Extremity Assessment Upper Extremity Assessment: Overall WFL for tasks assessed   Lower Extremity Assessment Lower Extremity Assessment: Overall WFL for tasks assessed   Cervical / Trunk Assessment Cervical / Trunk Assessment: Normal   Communication Communication Communication: No difficulties   Cognition Arousal/Alertness: Awake/alert Behavior During Therapy: WFL for tasks assessed/performed Overall Cognitive Status: Within Functional Limits for tasks assessed                                     General Comments       Exercises     Shoulder Instructions      Home Living Family/patient expects to be discharged to:: Private residence Living Arrangements: Spouse/significant other Available Help at Discharge: Family Type of Home: House Home  Access: Stairs to enter [employer] of Steps: 6 Entrance Stairs-Rails: Can reach both Home Layout: One level     Bathroom Shower/Tub: Producer, television/film/video: Standard     Home Equipment: None          Prior Functioning/Environment Level of Independence: Independent        Comments: was independent and active prior to his accident         OT Problem List:        OT Treatment/Interventions:      OT Goals(Current goals can be found in the care plan section) Acute Rehab OT Goals Patient Stated Goal: to have less pain  OT Goal Formulation: With patient  OT Frequency:     Barriers to D/C:            Co-evaluation              AM-PAC OT "6  Clicks" Daily Activity     Outcome Measure Help from another person eating meals?: None Help from another person taking care of personal grooming?: None Help from another person toileting, which includes using toliet, bedpan, or urinal?: None Help from another person bathing (including washing, rinsing, drying)?: A Little Help from another person to put on and taking off regular upper body clothing?: None Help from another person to put on and taking off regular lower body clothing?: None 6 Click Score: 23   End of Session Equipment Utilized During Treatment: Gait belt Nurse Communication: Mobility status  Activity Tolerance: Patient tolerated treatment well Patient left: in chair;with call bell/phone within reach  OT Visit Diagnosis: Pain Pain - Right/Left: Right Pain - part of body:  (Flank)                Time: 6269-4854 OT Time Calculation (min): 12 min Charges:  OT General Charges $OT Visit: 1 Visit OT Evaluation $OT Eval Low Complexity: 1 Low  Cherrie Franca H. OTR/L Supplemental OT, Department of rehab services [phone]  Artia Singley R H. 04/30/2020, 2:03 PM

## 2020-04-30 NOTE — Progress Notes (Addendum)
[facility] Progress Note     Subjective: CC:  C/o R chest wall pain with R rib "popping" during inspiration. Denies abdominal pain. Reports a lot of flatus yesterday. Tolerating CLD with pain, nausea, or emesis. Denies urinary sxs. Pulling 1750 on IS.  Objective: Vital signs in last 24 hours: Temp:  [97.8 F (36.6 C)-98.2 F (36.8 C)] 98 F (36.7 C) (09/25 0401) Pulse Rate:  [67-82] 81 (09/25 0401) Resp:  [12-25] 18 (09/25 0401) BP: (115-136)/(67-94) 115/69 (09/25 0401) SpO2:  [94 %-100 %] 98 % (09/25 0401) Last BM Date: 04/29/20  Intake/Output from previous day: 09/24 0701 - 09/25 0700 In: 337.3 [P.O.:240; I.V.:97.3] Out: 500 [Urine:500] Intake/Output this shift: No intake/output data recorded.  PE: Gen:  Alert, NAD, pleasant Card:  Regular rate and rhythm, pedal pulses 2+ BL Pulm:  R lateral chest wall tenderness with mild crepitus, Normal effort, clear to auscultation bilaterally Abd: Soft, non-tender, non-distended, bowel sounds present in all 4 quadrants, no HSM Skin: warm and dry, no rashes  Psych: A&Ox3   Lab Results:  Recent Labs    04/29/20 2315 04/30/20 0600  WBC 13.4* 10.8*  HGB 13.7 14.1  HCT 40.7 41.0  PLT 193 189   BMET Recent Labs    04/29/20 0825 04/29/20 0825 04/29/20 0828 04/30/20 0600  NA 138   < > 138 137  K 4.0   < > 3.9 3.6  CL 105   < > 104 103  CO2 20*  --   --  24  GLUCOSE 129*   < > 127* 110*  BUN 21*   < > 25* 14  CREATININE 0.99   < > 1.00 0.76  CALCIUM 9.0  --   --  8.5*   < > = values in this interval not displayed.   PT/INR Recent Labs    04/29/20 0825  LABPROT 12.4  INR 1.0   CMP     Component Value Date/Time   NA 137 04/30/2020 0600   K 3.6 04/30/2020 0600   CL 103 04/30/2020 0600   CO2 24 04/30/2020 0600   GLUCOSE 110 (H) 04/30/2020 0600   BUN 14 04/30/2020 0600   CREATININE 0.76 04/30/2020 0600   CALCIUM 8.5 (L) 04/30/2020 0600   PROT 6.1 (L) 04/30/2020 0600   ALBUMIN 3.6 04/30/2020 0600    AST 96 (H) 04/30/2020 0600   ALT 198 (H) 04/30/2020 0600   ALKPHOS 86 04/30/2020 0600   BILITOT 1.3 (H) 04/30/2020 0600   GFRNONAA >60 04/30/2020 0600   GFRAA >60 04/30/2020 0600   Lipase  No results found for: LIPASE     Studies/Results: CT Chest W Contrast  Result Date: 04/29/2020 CLINICAL DATA:  Abdominal trauma. CT CAP with contrast due to trauma Arrived to ED via [facility]; Reported patient was moving pallets at a high rise location when the pallets securement device snapped and he was pinned against the pallets and wall. EXAM: CT CHEST, ABDOMEN, AND PELVIS WITH CONTRAST TECHNIQUE: Multidetector CT imaging of the chest, abdomen and pelvis was performed following the standard protocol during bolus administration of intravenous contrast. CONTRAST:  OMNIPAQUE IOHEXOL 300 MG/ML  SOLN COMPARISON:  None FINDINGS: CT CHEST FINDINGS Cardiovascular: No significant vascular findings. Normal heart size. No pericardial effusion. Mediastinum/Nodes: The visualized portion of the thyroid gland has a normal appearance. The office is normal in appearance. No mediastinal or hilar adenopathy. Axillary regions are unremarkable. Lungs/Pleura: No pneumothorax. There are no pleural effusions or focal consolidations/contusions. Throughout  all lobes, there are scattered tiny solid and lucent centered lesions, largest measuring 5 millimeters. RIGHT UPPER lobe lesions are best seen on images 23, 34, 43, 45, 79 of series 5. Similar nodules are identified in all lobes. Musculoskeletal: There are acute fractures of the RIGHT ribs 6-9. The sternum is intact. No vertebral fracture. CT ABDOMEN PELVIS FINDINGS Hepatobiliary: There is an acute laceration of the posterior segment of the RIGHT hepatic lobe associated with subcapsular hematoma. Laceration measures 5.4 centimeters on image 58 of series 3. Subcapsular hematoma measures 6.7 x 2.0 centimeters on image 62 of series 3. No liver lesions identified.  Gallbladder is distended and contains numerous calcified stones. Pancreas: Unremarkable. No pancreatic ductal dilatation or surrounding inflammatory changes. Spleen: Normal in size without focal abnormality. Adrenals/Urinary Tract: Adrenal glands are unremarkable. Kidneys are normal, without renal calculi, focal lesion, or hydronephrosis. Bladder is unremarkable. Stomach/Bowel: Stomach and small bowel loops are normal in appearance. There is a small amount of mesenteric stranding in the RIGHT UPPER QUADRANT adjacent to the hepatic flexure. No focal wall thickening colon is unremarkable. The appendix is well seen and has a normal appearance. Vascular/Lymphatic: No significant vascular findings are present. No enlarged abdominal or pelvic lymph nodes. Reproductive: Prostate is unremarkable. Other: No ascites.  Anterior abdominal wall is unremarkable. Musculoskeletal: No acute fractures of the pelvis or lumbar spine. IMPRESSION: 1. Acute grade 3 laceration of the posterior segment of the RIGHT hepatic lobe associated with subcapsular hematoma. 2. Acute fractures of the RIGHT ribs 6-9. 3. Small amount of mesenteric stranding in the RIGHT UPPER QUADRANT adjacent to the hepatic flexure of the colon, consistent with mesenteric injury. No evidence for bowel injury. 4. Numerous tiny solid and lucent centered lesions throughout the lungs, measuring up to 5 millimeters. Differential considerations include infectious, inflammatory, or neoplastic process. Recommend pulmonary consultation and follow-up chest CT in 3 months. 5. Distended gallbladder and cholelithiasis. These results were called by telephone at the time of interpretation on 04/29/2020 at 9:32 am to provider [facility] , who verbally acknowledged these results. Electronically Signed   By: Norva Pavlov M.D.   On: 04/29/2020 09:33   CT ABDOMEN PELVIS W CONTRAST  Result Date: 04/29/2020 CLINICAL DATA:  Abdominal trauma. CT CAP with contrast due to trauma  Arrived to ED via [facility]; Reported patient was moving pallets at a high rise location when the pallets securement device snapped and he was pinned against the pallets and wall. EXAM: CT CHEST, ABDOMEN, AND PELVIS WITH CONTRAST TECHNIQUE: Multidetector CT imaging of the chest, abdomen and pelvis was performed following the standard protocol during bolus administration of intravenous contrast. CONTRAST:  OMNIPAQUE IOHEXOL 300 MG/ML  SOLN COMPARISON:  None FINDINGS: CT CHEST FINDINGS Cardiovascular: No significant vascular findings. Normal heart size. No pericardial effusion. Mediastinum/Nodes: The visualized portion of the thyroid gland has a normal appearance. The office is normal in appearance. No mediastinal or hilar adenopathy. Axillary regions are unremarkable. Lungs/Pleura: No pneumothorax. There are no pleural effusions or focal consolidations/contusions. Throughout all lobes, there are scattered tiny solid and lucent centered lesions, largest measuring 5 millimeters. RIGHT UPPER lobe lesions are best seen on images 23, 34, 43, 45, 79 of series 5. Similar nodules are identified in all lobes. Musculoskeletal: There are acute fractures of the RIGHT ribs 6-9. The sternum is intact. No vertebral fracture. CT ABDOMEN PELVIS FINDINGS Hepatobiliary: There is an acute laceration of the posterior segment of the RIGHT hepatic lobe associated with subcapsular hematoma. Laceration measures 5.4  centimeters on image 58 of series 3. Subcapsular hematoma measures 6.7 x 2.0 centimeters on image 62 of series 3. No liver lesions identified. Gallbladder is distended and contains numerous calcified stones. Pancreas: Unremarkable. No pancreatic ductal dilatation or surrounding inflammatory changes. Spleen: Normal in size without focal abnormality. Adrenals/Urinary Tract: Adrenal glands are unremarkable. Kidneys are normal, without renal calculi, focal lesion, or hydronephrosis. Bladder is unremarkable. Stomach/Bowel:  Stomach and small bowel loops are normal in appearance. There is a small amount of mesenteric stranding in the RIGHT UPPER QUADRANT adjacent to the hepatic flexure. No focal wall thickening colon is unremarkable. The appendix is well seen and has a normal appearance. Vascular/Lymphatic: No significant vascular findings are present. No enlarged abdominal or pelvic lymph nodes. Reproductive: Prostate is unremarkable. Other: No ascites.  Anterior abdominal wall is unremarkable. Musculoskeletal: No acute fractures of the pelvis or lumbar spine. IMPRESSION: 1. Acute grade 3 laceration of the posterior segment of the RIGHT hepatic lobe associated with subcapsular hematoma. 2. Acute fractures of the RIGHT ribs 6-9. 3. Small amount of mesenteric stranding in the RIGHT UPPER QUADRANT adjacent to the hepatic flexure of the colon, consistent with mesenteric injury. No evidence for bowel injury. 4. Numerous tiny solid and lucent centered lesions throughout the lungs, measuring up to 5 millimeters. Differential considerations include infectious, inflammatory, or neoplastic process. Recommend pulmonary consultation and follow-up chest CT in 3 months. 5. Distended gallbladder and cholelithiasis. These results were called by telephone at the time of interpretation on 04/29/2020 at 9:32 am [facility] , who verbally acknowledged these results. Electronically Signed   By: Norva PavlovElizabeth  Brown M.D.   On: 04/29/2020 09:33   DG Pelvis Portable  Result Date: 04/29/2020 CLINICAL DATA:  Trauma, pain EXAM: PORTABLE PELVIS 1-2 VIEWS COMPARISON:  None. FINDINGS: There is no evidence of pelvic fracture or diastasis. No pelvic bone lesions are seen. IMPRESSION: Negative. Electronically Signed   By: Judie PetitM.  Shick M.D.   On: 04/29/2020 08:36   DG Chest Port 1 View  Result Date: 04/29/2020 CLINICAL DATA:  Trauma.  Right chest pain. EXAM: PORTABLE CHEST 1 VIEW COMPARISON:  None. FINDINGS: The heart size and mediastinal contours are  within normal limits. No consolidation. No discernible pneumothorax. No pleural effusions. Biapical pleuroparenchymal scarring. Acute mildly displaced right lateral sixth rib fracture. IMPRESSION: 1. Acute mildly displaced right lateral sixth rib fracture. 2. No evidence of acute cardiopulmonary abnormality.No discernible pneumothorax. Electronically Signed   By: Feliberto HartsFrederick S Jones MD   On: 04/29/2020 08:39    Anti-infectives: Anti-infectives (From admission, onward)   None     Assessment/Plan Right chest pinned by pallet Grade 2-3 liver lac - grade 2 per trauma standards. ok to mobilize; H&H WNL this morning 14/41, WBC 10.8 R rib fxs 6-9 - multimodal pain control and pulm toilet, no pneumothorax or hemothorax on CXR today. RUQ mesenteric stranding -  no evidence of bowel injury on CT. Monitor abdominal exams, ok for CLD Elevated LFTs - likely 2/2 liver lac, downtrending -- AST 96 (188), ALT 198 (248), bili slightly up 1.3 from 0.7, repeat in AM. Tobacco abuse - nicotine patch Lung lesions - will need follow up CT chest in 3 months  ID - none VTE - SCDs only FEN - IVF, advance to FLD, allow SOFT at dinner if tolerates fulls Foley - none Follow up - TBD  Plan - inpatient, med-surg level, PT/OT pending, increase pain control - add scheduled ibuprofen, increase robaxin, add lidoderm patch.  LOS: 1 day    Hosie Spangle, [facility] Please see Amion for pager number during day hours 7:00am-4:30pm   Agree with above. Pain control Mobilize No sign of bleeding from liver lac.  Wilmon Arms. Corliss Skains, MD, [facility]  General/ Trauma Surgery   04/30/2020 11:13 AM

## 2020-04-30 NOTE — Evaluation (Signed)
Physical Therapy Evaluation Patient Details Name: Seth Rios MRN: [id number] DOB: Aug 19, 1980 Today's Date: 04/30/2020   History of Present Illness  Patient was pinned between a plalte and suffered right sided rib fractures and a liver laceration. PMH: [place]   Clinical Impression  Despite high  levels of pain the patient appears to be at baseline mobility. He was able to ambulate and transfer without a device. Therapy reviewed a log roll to the left which helped him get out of bed easier. His wife will be home to assist time. He has no skilled physical therapy needs at this time.     Follow Up Recommendations No PT follow up    Equipment Recommendations  None recommended by PT    Recommendations for Other Services       Precautions / Restrictions Precautions Precautions: None Restrictions Weight Bearing Restrictions: No      Mobility  Bed Mobility Overal bed mobility: Independent             General bed mobility comments: Therapy reviewed log roll to the left to decreased pressure on ribs. he reported very little pain compared to other times he has sat up   Transfers Overall transfer level: Independent               General transfer comment: stood without device. Minor increase in pain. No syncope noted.   Ambulation/Gait Ambulation/Gait assistance: Independent Gait Distance (Feet): 200 Feet Assistive device: None Gait Pattern/deviations: WFL(Within Functional Limits) Gait velocity: decreased    General Gait Details: used the IV pole at first but trialed without the IV pole and he had no difficulty   Stairs            Wheelchair Mobility    Modified Rankin (Stroke Patients Only)       Balance Overall balance assessment: Independent                                           Pertinent Vitals/Pain Pain Assessment: 0-10 Pain Score: 6  Pain Location: right ribs  Pain Descriptors / Indicators: Aching Pain Intervention(s):  Limited activity within patient's tolerance;Monitored during session;Premedicated before session;Repositioned    Home Living Family/patient expects to be discharged to:: Private residence Living Arrangements: Spouse/significant other Available Help at Discharge: Family Type of Home: House Home Access: Stairs to enter Entrance Stairs-Rails: Can reach both Entrance Stairs-Number of Steps: 6 Home Layout: One level        Prior Function Level of Independence: Independent         Comments: was independent and active prior to his accident      Hand Dominance   Dominant Hand: Right    Extremity/Trunk Assessment   Upper Extremity Assessment Upper Extremity Assessment: Defer to OT evaluation    Lower Extremity Assessment Lower Extremity Assessment: Overall WFL for tasks assessed    Cervical / Trunk Assessment Cervical / Trunk Assessment: Normal  Communication   Communication: No difficulties  Cognition Arousal/Alertness: Awake/alert Behavior During Therapy: WFL for tasks assessed/performed Overall Cognitive Status: Within Functional Limits for tasks assessed                                        General Comments      Exercises     Assessment/Plan  PT Assessment Patent does not need any further PT services  PT Problem List         PT Treatment Interventions      PT Goals (Current goals can be found in the Care Plan section)  Acute Rehab PT Goals Patient Stated Goal: to have less pain  PT Goal Formulation: With patient Time For Goal Achievement: 05/07/20 Potential to Achieve Goals: Good    Frequency     Barriers to discharge        Co-evaluation               AM-PAC PT "6 Clicks" Mobility  Outcome Measure Help needed turning from your back to your side while in a flat bed without using bedrails?: None Help needed moving from lying on your back to sitting on the side of a flat bed without using bedrails?: None Help needed  moving to and from a bed to a chair (including a wheelchair)?: None Help needed standing up from a chair using your arms (e.g., wheelchair or bedside chair)?: None Help needed to walk in hospital room?: None Help needed climbing 3-5 steps with a railing? : None 6 Click Score: 24    End of Session   Activity Tolerance: Patient tolerated treatment well Patient left: in chair;with call bell/phone within reach Nurse Communication: Mobility status PT Visit Diagnosis: Other abnormalities of gait and mobility (R26.89)    Time: 6203-5597 PT Time Calculation (min) (ACUTE ONLY): 25 min   Charges:   PT Evaluation $PT Eval Low Complexity: 1 Low          Dessie Coma PT DPT  04/30/2020, 11:47 AM

## 2020-05-01 LAB — COMPREHENSIVE METABOLIC PANEL
ALT: 139 U/L — ABNORMAL HIGH (ref 0–44)
AST: 45 U/L — ABNORMAL HIGH (ref 15–41)
Albumin: 3.3 g/dL — ABNORMAL LOW (ref 3.5–5.0)
Alkaline Phosphatase: 78 U/L (ref 38–126)
Anion gap: 9 (ref 5–15)
BUN: 12 mg/dL (ref 6–20)
CO2: 23 mmol/L (ref 22–32)
Calcium: 8.5 mg/dL — ABNORMAL LOW (ref 8.9–10.3)
Chloride: 107 mmol/L (ref 98–111)
Creatinine, Ser: 0.81 mg/dL (ref 0.61–1.24)
GFR calc Af Amer: >60 mL/min (ref >60)
GFR calc non Af Amer: >60 mL/min (ref >60)
Glucose, Bld: 95 mg/dL (ref 70–99)
Potassium: 3.7 mmol/L (ref 3.5–5.1)
Sodium: 139 mmol/L (ref 135–145)
Total Bilirubin: 0.9 mg/dL (ref 0.3–1.2)
Total Protein: 5.4 g/dL — ABNORMAL LOW (ref 6.5–8.1)

## 2020-05-01 MED ORDER — LIDOCAINE 5 % EX PTCH: 1.0000 | MEDICATED_PATCH | CUTANEOUS | 1 refills | Status: DC

## 2020-05-01 MED ORDER — METHOCARBAMOL 750 MG PO TABS: 750.0000 mg | ORAL_TABLET | Freq: Three times a day (TID) | ORAL | 0 refills | Status: DC | PRN

## 2020-05-01 MED ORDER — IBUPROFEN 200 MG PO TABS
200.0000 mg | ORAL_TABLET | Freq: Three times a day (TID) | ORAL | Status: DC
Start: 2020-05-01 — End: 2020-11-03

## 2020-05-01 MED ORDER — OXYCODONE HCL 5 MG PO TABS
10.0000 mg | ORAL_TABLET | Freq: Four times a day (QID) | ORAL | 0 refills | Status: DC | PRN
Start: 2020-05-01 — End: 2020-06-02

## 2020-05-01 MED ORDER — DOCUSATE SODIUM 100 MG PO CAPS
100.0000 mg | ORAL_CAPSULE | Freq: Two times a day (BID) | ORAL | 0 refills | Status: DC
Start: 2020-05-01 — End: 2020-06-02

## 2020-05-01 MED ORDER — ACETAMINOPHEN 325 MG PO TABS: 650.0000 mg | ORAL_TABLET | Freq: Four times a day (QID) | ORAL | Status: DC | PRN

## 2020-05-01 MED ORDER — POLYETHYLENE GLYCOL 3350 17 G PO PACK: 17.0000 g | PACK | Freq: Every day | ORAL | 0 refills | Status: DC

## 2020-05-01 NOTE — Care Management (Addendum)
Patient reports he had a PCP at [facility]  His doctor recently left the practice, but he plans to establish with another doctor in that practice.  He has Media planner.  He understands to f/u there to arrange for CT of chest in 3 months.

## 2020-05-01 NOTE — Discharge Instructions (Signed)
A social worker was consulted to see you about establishing a primary care provider/family doctor. There was an incidental finding on your chest CT scan of a lung lesion that will require outpatient follow up. Please see a primary care provider in the next couple of weeks to get established and discuss this finding.   Liver Laceration  A liver laceration is a tear or a cut in the liver. The liver is an organ that is involved in many important bodily functions. Sometimes, a liver laceration can be a very serious injury. It can cause a lot of bleeding, and surgery may be needed. Other times, a liver laceration may be minor, and bed rest may be all that is needed. Either way, treatment in a hospital is almost always required. Liver lacerations are categorized in grades from 1 to 5. Low numbers identify lacerations that are less severe than lacerations with high numbers.  Grade 1: This is a tear in the outer lining of the liver. It is less than  inch (1 cm) deep.  Grade 2: This is a tear that is about  inch to 1 inch (1 to 3 cm) deep. It is less than 4 inches (10 cm) long.  Grade 3: This is a tear that is slightly more than 1 inch (3 cm) deep.  Grades 4 and 5: These lacerations are very deep. They affect a large part of the liver. What are the causes? This condition may be caused by:  A forceful hit to the area around the liver (blunt trauma), such as in a car crash. Blunt trauma can tear the liver even though it does not break the skin.  An injury in which an object goes through the skin and into the liver (penetrating injury), such as a stab or gunshot wound. What are the signs or symptoms? Common symptoms of this condition include:  A swollen and firm abdomen.  Pain in the abdomen.  Tenderness when pressing on the right side of the abdomen. Other symptoms include:  Bleeding from a penetrating wound.  Bruises on the abdomen.  A fast heartbeat.  Taking quick breaths.  Feeling  weak and dizzy. How is this diagnosed? To diagnose this condition, your health care provider will do a physical exam and ask about any injuries to the right side of your abdomen. You may have various tests, such as:  Blood tests. Your blood may be tested every few hours. This will show whether you are losing blood.  CT scan. This test is done to check for laceration or bleeding.  Laparoscopy. This involves placing a small camera into the abdomen and looking directly at the surface of the liver. How is this treated? Treatment depends on how deep the laceration is and how much bleeding you have. Treatment options include:  Monitoring and bed rest at the hospital. You will have tests often.  Receiving donated blood through an IV tube (transfusion) to replace blood that you have lost. You may need several transfusions.  Surgery to pack gauze pads or special material around the laceration to help it heal or to repair the laceration. Follow these instructions at home:  Take over-the-counter and prescription medicines only as told by your health care provider. Do not take any other medicines unless you ask your health care provider about them first.  Do not drive or use heavy machinery while taking prescription pain medicines.  Rest and limit your activity as told by your health care provider. It may be several  months before you can return to your usual routine. Do not participate in activities that involve physical contact or require extra energy until your health care provider approves.  Keep all follow-up visits as told by your health care provider. This is important. Contact a health care provider if:  Your abdominal pain does not go away.  You feel more weak and tired than usual. Get help right away if:  Your abdominal pain gets worse.  You have a cut on your skin that: ? Has more redness, swelling, or pain around it. ? Has more fluid or blood coming from it. ? Feels warm to the  touch. ? Has pus or a bad smell coming from it.  You feel dizzy or very weak.  You have trouble breathing.  You have a fever. This information is not intended to replace advice given to you by your health care provider. Make sure you discuss any questions you have with your health care provider. Document Revised: 07/05/2017 Document Reviewed: 03/09/2016 Elsevier Patient Education  2020 [employer]   Rib Fracture  A rib fracture is a break or crack in one of the bones of the ribs. The ribs are long, curved bones that wrap around your chest and attach to your spine and your breastbone. The ribs protect your heart, lungs, and other organs in the chest. A broken or cracked rib is often painful but is not usually serious. Most rib fractures heal on their own over time. However, rib fractures can be more serious if multiple ribs are broken or if broken ribs move out of place and push against other structures or organs. What are the causes? This condition is caused by:  Repetitive movements with high force, such as pitching a baseball or having severe coughing spells.  A direct blow to the chest, such as a sports injury, a car accident, or a fall.  Cancer that has spread to the bones, which can weaken bones and cause them to break. What are the signs or symptoms? Symptoms of this condition include:  Pain when you breathe in or cough.  Pain when someone presses on the injured area.  Feeling short of breath. How is this diagnosed? This condition is diagnosed with a physical exam and medical history. Imaging tests may also be done, such as:  Chest X-ray.  CT scan.  MRI.  Bone scan.  Chest ultrasound. How is this treated? Treatment for this condition depends on the severity of the fracture. Most rib fractures usually heal on their own in 1-3 months. Sometimes healing takes longer if there is a cough that does not stop or if there are other activities that make the injury worse  (aggravating factors). While you heal, you will be given medicines to control the pain. You will also be taught deep breathing exercises. Severe injuries may require hospitalization or surgery. Follow these instructions at home: Managing pain, stiffness, and swelling  If directed, apply ice to the injured area. ? Put ice in a plastic bag. ? Place a towel between your skin and the bag. ? Leave the ice on for 20 minutes, 2-3 times a day.  Take over-the-counter and prescription medicines only as told by your health care provider. Activity  Avoid a lot of activity and any activities or movements that cause pain. Be careful during activities and avoid bumping the injured rib.  Slowly increase your activity as told by your health care provider. General instructions  Do deep breathing exercises as told by  your health care provider. This helps prevent pneumonia, which is a common complication of a broken rib. Your health care provider may instruct you to: ? Take deep breaths several times a day. ? Try to cough several times a day, holding a pillow against the injured area. ? Use a device called incentive spirometer to practice deep breathing several times a day.  Drink enough fluid to keep your urine pale yellow.  Do not wear a rib belt or binder. These restrict breathing, which can lead to pneumonia.  Keep all follow-up visits as told by your health care provider. This is important. Contact a health care provider if:  You have a fever. Get help right away if:  You have difficulty breathing or you are short of breath.  You develop a cough that does not stop, or you cough up thick or bloody sputum.  You have nausea, vomiting, or pain in your abdomen.  Your pain gets worse and medicine does not help. Summary  A rib fracture is a break or crack in one of the bones of the ribs.  A broken or cracked rib is often painful but is not usually serious.  Most rib fractures heal on their  own over time.  Treatment for this condition depends on the severity of the fracture.  Avoid a lot of activity and any activities or movements that cause pain. This information is not intended to replace advice given to you by your health care provider. Make sure you discuss any questions you have with your health care provider. Document Revised: 07/05/2017 Document Reviewed: 10/22/2016 Elsevier Patient Education  2020 [employer].

## 2020-05-01 NOTE — Progress Notes (Signed)
Pt discharged home in stable condition 

## 2020-05-01 NOTE — Discharge Summary (Signed)
[facility] Discharge Summary   Patient ID: Seth Rios MRN: [id number] DOB/AGE: 02/01/1981 39 y.o.  Admit date: 04/29/2020 Discharge date: 05/01/2020  Admitting Diagnosis: Right chest pinned by pallet Grade 2 liver lac Right rib fractures 6-9 RUQ mesenteric stranding Elevated LFTs  Tobacco abuse  Lung lesions   Discharge Diagnosis Patient Active Problem List   Diagnosis Date Noted  . Liver laceration 04/29/2020  Right chest pinned by pallet Grade 2 liver lac Right rib fractures 6-9 RUQ mesenteric stranding Elevated LFTs  Tobacco abuse  Lung lesions   Consultants Social Work   Imaging: DG Chest Port 1 View  Result Date: 04/30/2020 CLINICAL DATA:  Blunt chest trauma.  Multiple right rib fractures. EXAM: PORTABLE CHEST 1 VIEW COMPARISON:  04/29/2020 FINDINGS: The heart size and mediastinal contours are within normal limits. Both lungs are clear. A few mildly displaced right lateral rib fractures are again noted, however there is no evidence of pneumothorax or hemothorax. IMPRESSION: No active cardiopulmonary disease. Mildly displaced right lateral rib fractures, without pneumothorax or hemothorax. Electronically Signed   By: Danae Orleans M.D.   On: 04/30/2020 10:11   CT CHEST ABDOMEN PELVIS 04/29/20  IMPRESSION: 1. Acute grade 3 laceration of the posterior segment of the RIGHT hepatic lobe associated with subcapsular hematoma. Grade 2 according to AAST guidelines 2. Acute fractures of the RIGHT ribs 6-9. 3. Small amount of mesenteric stranding in the RIGHT UPPER QUADRANT adjacent to the hepatic flexure of the colon, consistent with mesenteric injury. No evidence for bowel injury. 4. Numerous tiny solid and lucent centered lesions throughout the lungs, measuring up to 5 millimeters. Differential considerations include infectious, inflammatory, or neoplastic process. Recommend pulmonary consultation and follow-up chest CT in 3 months. 5. Distended gallbladder  and cholelithiasis.  Procedures None  HPI:  Seth Rios is a 39yo male who was brought into [facility] as a level 2 trauma after being pinned by a palate on his right chest while at work. Reported flail chest per EMS therefore he was upgraded to a level 1. Upon arrival in the ED patient found to have stable vital signs, O2 sats 100% on a few liters O2 via [place]. Breath sounds present bilaterally. FAST exam negative. Complaining of right sided chest and abdominal pain. Worse with deep inspiration. Denies headache, neck pain, LOC, back pain, or pain in any extremity. Downgraded to level 2. Work up revealed grade 2-3 liver laceration, right rib fractures 6-9, RUQ mesenteric stranding without evidence of bowel injury. Trauma asked to see again for admission.  Hospital Course:   Workup showed Grade 2 liver lac, Right rib fractures 6-9, RUQ mesenteric stranding, Elevated LFTs, Tobacco abuse, and incidental finding of Lung lesions.  Patient was admitted for serial abdominal exams, multimodal pain control, and observation. On hospital day one the patient was hemodynamically stable, hgb/hct were WNL, and the patient denied abdominal pain. His LFT's were trending down towards normal after his acute liver injury. Diet was advanced as tolerated.   On 05/01/20, the patient was voiding well, tolerating diet, ambulating well, pain well controlled, vital signs stable, and felt stable for discharge home.  Patient will follow up in our office as needed and knows to call with questions or concerns.  Social work consult was ordered to get patient established with a PCP for outpatient follow up of incidental lung lesions noted on admission.   Physical Exam: General:  Alert, NAD, pleasant, comfortable Pulm: appropriately tender right chest wall, CTAB Abd:  Soft, nontender, non-distended, +BS  all 4 quadrants  Ext: normal gait, symmetrical, pedal pulses 2+ BL  Allergies as of 05/01/2020   No Known Allergies     Medication List     TAKE these medications   acetaminophen 325 MG tablet Commonly known as: TYLENOL Take 2 tablets (650 mg total) by mouth every 6 (six) hours as needed.   docusate sodium 100 MG capsule Commonly known as: COLACE Take 1 capsule (100 mg total) by mouth 2 (two) times daily.   ibuprofen 200 MG tablet Commonly known as: ADVIL Take 1-3 tablets (200-600 mg total) by mouth 3 (three) times daily.   lidocaine 5 % Commonly known as: LIDODERM Place 1 patch onto the skin daily. Remove & Discard patch within 12 hours or as directed by MD   methocarbamol 750 MG tablet Commonly known as: ROBAXIN Take 1 tablet (750 mg total) by mouth every 8 (eight) hours as needed for muscle spasms.   oxyCODONE 5 MG immediate release tablet Commonly known as: Oxy IR/ROXICODONE Take 2 tablets (10 mg total) by mouth every 6 (six) hours as needed for moderate pain or severe pain (pain not releived by tylenol or ibuprofen).   polyethylene glycol 17 g packet Commonly known as: MIRALAX / GLYCOLAX Take 17 g by mouth daily.   Tylenol Cold/Flu Severe 5-10-200-325 MG Tabs Generic drug: Phenylephrine-DM-GG-APAP Take 2 tablets by mouth every 6 (six) hours as needed (cold symptoms).         Signed: Hosie Spangle, [facility] 05/01/2020, 9:10 AM

## 2020-05-02 ENCOUNTER — Encounter (INDEPENDENT_AMBULATORY_CARE_PROVIDER_SITE_OTHER): Payer: Self-pay | Admitting: Physician Assistant

## 2020-06-02 ENCOUNTER — Ambulatory Visit: Payer: 59 | Admitting: Physician Assistant

## 2020-06-02 ENCOUNTER — Other Ambulatory Visit: Payer: Self-pay

## 2020-06-02 ENCOUNTER — Encounter: Payer: Self-pay | Admitting: Physician Assistant

## 2020-06-02 VITALS — BP 126/74 | HR 84 | Ht 69.0 in | Wt 163.4 lb

## 2020-06-02 DIAGNOSIS — R9389 Abnormal findings on diagnostic imaging of other specified body structures: Secondary | ICD-10-CM | POA: Diagnosis not present

## 2020-06-02 DIAGNOSIS — K8020 Calculus of gallbladder without cholecystitis without obstruction: Secondary | ICD-10-CM

## 2020-06-02 DIAGNOSIS — Z716 Tobacco abuse counseling: Secondary | ICD-10-CM

## 2020-06-02 DIAGNOSIS — R195 Other fecal abnormalities: Secondary | ICD-10-CM

## 2020-06-02 DIAGNOSIS — S36113A Laceration of liver, unspecified degree, initial encounter: Secondary | ICD-10-CM

## 2020-06-02 DIAGNOSIS — Z720 Tobacco use: Secondary | ICD-10-CM

## 2020-06-02 DIAGNOSIS — Z09 Encounter for follow-up examination after completed treatment for conditions other than malignant neoplasm: Secondary | ICD-10-CM

## 2020-06-02 DIAGNOSIS — S2241XA Multiple fractures of ribs, right side, initial encounter for closed fracture: Secondary | ICD-10-CM

## 2020-06-02 NOTE — Patient Instructions (Signed)

## 2020-06-02 NOTE — Progress Notes (Signed)
Established Patient Office Visit  Subjective:  Patient ID: Seth Rios, male    DOB: 09-10-1980  Age: 39 y.o. MRN: [id number]  CC:  Chief Complaint  Patient presents with  . Hospitalization Follow-up    HPI Seth Rios presents hospital follow up for trauma- pinned by a pallet.  Reports continues to recover and doing better. Not experiencing chest pain or shortness of breath. Has decreased smoking use to 1/2 PPD and eventually hopes will be able to quit. Is concerned lung nodules are related to cancer. States in the hospital was asked about prior TB infection which patient is unsure of and would like to get tested for.  Also has complaints of chronic loose stools. States has to drink an Ensure every other day to help with more firm stool. Denies abdominal pain.  Discharge summary: Admit date: 04/29/2020 Discharge date: 05/01/2020  Admitting Diagnosis: Right chest pinned by pallet Grade 2 liver lac Right rib fractures 6-9 RUQ mesenteric stranding Elevated LFTs  Tobacco abuse  Lung lesions   Discharge Diagnosis     Patient Active Problem List   Diagnosis Date Noted  . Liver laceration 04/29/2020  Right chest pinned by pallet Grade 2 liver lac Right rib fractures 6-9 RUQ mesenteric stranding Elevated LFTs  Tobacco abuse  Lung lesions   Consultants Social Work   Imaging:  Imaging Results (Last 48 hours)  DG Chest Port 1 View  Result Date: 04/30/2020 CLINICAL DATA:  Blunt chest trauma.  Multiple right rib fractures. EXAM: PORTABLE CHEST 1 VIEW COMPARISON:  04/29/2020 FINDINGS: The heart size and mediastinal contours are within normal limits. Both lungs are clear. A few mildly displaced right lateral rib fractures are again noted, however there is no evidence of pneumothorax or hemothorax. IMPRESSION: No active cardiopulmonary disease. Mildly displaced right lateral rib fractures, without pneumothorax or hemothorax. Electronically Signed   By: Danae Orleans M.D.   On:  04/30/2020 10:11    CT CHEST ABDOMEN PELVIS 04/29/20  IMPRESSION: 1. Acute grade 3 laceration of the posterior segment of the RIGHT hepatic lobe associated with subcapsular hematoma. Grade 2 according to AAST guidelines 2. Acute fractures of the RIGHT ribs 6-9. 3. Small amount of mesenteric stranding in the RIGHT UPPER QUADRANT adjacent to the hepatic flexure of the colon, consistent with mesenteric injury. No evidence for bowel injury. 4. Numerous tiny solid and lucent centered lesions throughout the lungs, measuring up to 5 millimeters. Differential considerations include infectious, inflammatory, or neoplastic process. Recommend pulmonary consultation and follow-up chest CT in 3 months. 5. Distended gallbladder and cholelithiasis.  Procedures None  HPI:  Seth Rios [id number] who was brought into [facility] as a level 2 trauma after being pinned by a palate on his right chest while at work. Reported flail chest per EMS therefore he was upgraded to a level 1. Upon arrival in the ED patient found to have stable vital signs, O2 sats 100% on a few liters O2 via [place]. Breath sounds present bilaterally. FAST exam negative. Complaining of right sided chest and abdominal pain. Worse with deep inspiration. Denies headache, neck pain, LOC, back pain, or pain in any extremity. Downgraded to level 2. Work up revealed grade 2-3 liver laceration, right rib fractures 6-9, RUQ mesenteric stranding without evidence of bowel injury. Trauma asked to see again for admission.  Hospital Course:   Workup showed Grade 2 liver lac, Right rib fractures 6-9, RUQ mesenteric stranding, Elevated LFTs, Tobacco abuse, and incidental finding of Lung lesions.  Patient was admitted  for serial abdominal exams, multimodal pain control, and observation. On hospital day one the patient was hemodynamically stable, hgb/hct were WNL, and the patient denied abdominal pain. His LFT's were trending down towards normal after his  acute liver injury. Diet was advanced as tolerated.   On 05/01/20, the patient was voiding well, tolerating diet, ambulating well, pain well controlled, vital signs stable, and felt stable for discharge home.  Patient will follow up in our office as needed and knows to call with questions or concerns.  Social work consult was ordered to get patient established with a PCP for outpatient follow up of incidental lung lesions noted on admission.      Past Medical History:  Diagnosis Date  . Asthma    as a child  . Asthma    childhood asthma  . Liver laceration 04/29/2020    Past Surgical History:  Procedure Laterality Date  . WISDOM TOOTH EXTRACTION      Family History  Problem Relation Age of Onset  . Skin cancer Father   . Diabetes Father   . Skin cancer Brother   . Prostate cancer Paternal Uncle   . Stroke Maternal Grandfather   . Alzheimer's disease Maternal Grandfather   . Parkinson's disease Maternal Grandfather   . Pneumonia Maternal Grandfather   . High Cholesterol Other   . Hypertension Other   . Heart disease Mother   . Diabetes Mother   . Alcohol abuse Paternal Grandfather   . Emphysema Paternal Grandfather        smoker    Social History   Socioeconomic History  . Marital status: Married    Spouse name: Not on file  . Number of children: 0  . Years of education: Not on file  . Highest education level: Not on file  Occupational History  . 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  Tobacco Use  . Smoking status: Current Every Day Smoker    Packs/day: 1.00    Years: 20.00    Pack years: 20.00    Types: Cigarettes  . Smokeless tobacco: Never Used  Vaping Use  . Vaping Use: Never used  Substance and Sexual Activity  . Alcohol use: Yes    Comment: rare  . Drug use: Yes    Types: Marijuana  . Sexual activity: Not Currently  Other Topics Concern  . Not on file  Social History Narrative   ** Merged History Encounter **       Social Determinants of Health    Financial Resource Strain:   . Difficulty of Paying Living Expenses: Not on file  Food Insecurity:   . Worried About Programme researcher, broadcasting/film/video in the Last Year: Not on file  . Ran Out of Food in the Last Year: Not on file  Transportation Needs:   . Lack of Transportation (Medical): Not on file  . Lack of Transportation (Non-Medical): Not on file  Physical Activity:   . Days of Exercise per Week: Not on file  . Minutes of Exercise per Session: Not on file  Stress:   . Feeling of Stress : Not on file  Social Connections:   . Frequency of Communication with Friends and Family: Not on file  . Frequency of Social Gatherings with Friends and Family: Not on file  . Attends Religious Services: Not on file  . Active Member of Clubs or Organizations: Not on file  . Attends Banker Meetings: Not on file  . Marital Status: Not on file  Intimate  Partner Violence:   . Fear of Current or Ex-Partner: Not on file  . Emotionally Abused: Not on file  . Physically Abused: Not on file  . Sexually Abused: Not on file    Outpatient Medications Prior to Visit  Medication Sig Dispense Refill  . acetaminophen (TYLENOL) 325 MG tablet Take 2 tablets (650 mg total) by mouth every 6 (six) hours as needed.    Marland Kitchen ibuprofen (ADVIL) 200 MG tablet Take 1-3 tablets (200-600 mg total) by mouth 3 (three) times daily.    Marland Kitchen Phenylephrine-DM-GG-APAP (TYLENOL COLD/FLU SEVERE) 5-10-200-325 MG TABS Take 2 tablets by mouth every 6 (six) hours as needed (cold symptoms).    . Cholecalciferol (VITAMIN D3) 125 MCG (5000 UT) TABS 5,000 IU OTC vitamin D3 daily. (Patient not taking: Reported on 06/02/2020) 90 tablet 3  . docusate sodium (COLACE) 100 MG capsule Take 1 capsule (100 mg total) by mouth 2 (two) times daily. (Patient not taking: Reported on 06/02/2020) 10 capsule 0  . FLUoxetine (PROZAC) 20 MG tablet 1/2 tab for 4 d, then 1 po qd (Patient not taking: Reported on 06/02/2020) 90 tablet 1  . lidocaine (LIDODERM) 5  % Place 1 patch onto the skin daily. Remove & Discard patch within 12 hours or as directed by MD (Patient not taking: Reported on 06/02/2020) 14 patch 1  . Melatonin 5 MG TABS Take by mouth. (Patient not taking: Reported on 06/02/2020)    . methocarbamol (ROBAXIN) 750 MG tablet Take 1 tablet (750 mg total) by mouth every 8 (eight) hours as needed for muscle spasms. (Patient not taking: Reported on 06/02/2020) 20 tablet 0  . Multiple Vitamin (MULTIVITAMIN) tablet Take 1 tablet by mouth daily. (Patient not taking: Reported on 06/02/2020)    . oxyCODONE (OXY IR/ROXICODONE) 5 MG immediate release tablet Take 2 tablets (10 mg total) by mouth every 6 (six) hours as needed for moderate pain or severe pain (pain not releived by tylenol or ibuprofen). (Patient not taking: Reported on 06/02/2020) 30 tablet 0  . polyethylene glycol (MIRALAX / GLYCOLAX) 17 g packet Take 17 g by mouth daily. (Patient not taking: Reported on 06/02/2020) 14 each 0   No facility-administered medications prior to visit.    No Known Allergies  ROS Review of Systems A fourteen system review of systems was performed and found to be positive as per HPI.   Objective:    Physical Exam General:  Well Developed, well nourished, appropriate for stated age.  Neuro:  Alert and oriented,  extra-ocular muscles intact  HEENT:  Normocephalic, atraumatic, neck supple Skin:  no gross rash, warm, pink. Cardiac:  RRR, S1 S2 Respiratory:  ECTA B/L, Not using accessory muscles, speaking in full sentences- unlabored. No flail chest noted. Vascular:  Ext warm, no cyanosis apprec.; cap RF less 2 sec. Psych:  No HI/SI, judgement and insight good, Euthymic mood. Full Affect.   BP 126/74   Pulse 84   Ht 5\' 9"  (1.753 m)   Wt 163 lb 6.4 oz (74.1 kg)   SpO2 97%   BMI 24.13 kg/m  Wt Readings from Last 3 Encounters:  06/06/20 163 lb 12.8 oz (74.3 kg)  06/02/20 163 lb 6.4 oz (74.1 kg)  04/29/20 165 lb (74.8 kg)     Health Maintenance Due   Topic Date Due  . Hepatitis C Screening  Never done  . COVID-19 Vaccine (1) Never done  . TETANUS/TDAP  Never done  . INFLUENZA VACCINE  Never done    There are  no preventive care reminders to display for this patient.  Lab Results  Component Value Date   TSH 1.270 03/24/2019   Lab Results  Component Value Date   WBC 10.8 (H) 04/30/2020   HGB 14.1 04/30/2020   HCT 41.0 04/30/2020   MCV 95.8 04/30/2020   PLT 189 04/30/2020   Lab Results  Component Value Date   NA 139 05/01/2020   K 3.7 05/01/2020   CO2 23 05/01/2020   GLUCOSE 95 05/01/2020   BUN 12 05/01/2020   CREATININE 0.81 05/01/2020   BILITOT 0.9 05/01/2020   ALKPHOS 78 05/01/2020   AST 45 (H) 05/01/2020   ALT 139 (H) 05/01/2020   PROT 5.4 (L) 05/01/2020   ALBUMIN 3.3 (L) 05/01/2020   CALCIUM 8.5 (L) 05/01/2020   ANIONGAP 9 05/01/2020   Lab Results  Component Value Date   CHOL 200 (H) 03/24/2019   Lab Results  Component Value Date   HDL 38 (L) 03/24/2019   Lab Results  Component Value Date   LDLCALC 137 (H) 03/24/2019   Lab Results  Component Value Date   TRIG 124 03/24/2019   Lab Results  Component Value Date   CHOLHDL 5.3 (H) 03/24/2019   Lab Results  Component Value Date   HGBA1C 5.7 (H) 03/24/2019      Assessment & Plan:   Problem List Items Addressed This Visit      Digestive   Liver laceration    Other Visit Diagnoses    Hospital discharge follow-up    -  Primary   Abnormal finding on CT scan       Relevant Orders   Ambulatory referral to Pulmonology   Loose stools       Relevant Orders   Ambulatory referral to Gastroenterology   Calculus of gallbladder without cholecystitis without obstruction       Relevant Orders   Ambulatory referral to Gastroenterology   Closed fracture of multiple ribs of right side, initial encounter       Tobacco abuse       Encounter for tobacco use cessation counseling         Hospital discharge follow-up, liver laceration, closed fracture of  multiples ribs of right side: -Patient is approximately 5 weeks post trauma injury and has recovered well without complications. -Recommend to increase physical activity as tolerated. -Vital signs wnl.   Abnormal finding on CT scan: -Will place referral to pulmonology for further evaluation. -Advised patient to schedule nurse visit for TB skin test early next week. -Encouraged to continue reducing smoking use. -Smoking cessation instruction/counseling given:  counseled patient on the dangers of tobacco use, advised patient to stop smoking, and reviewed strategies to maximize success.  Calculus of gallbladder without cholecystitis without obstruction, loose stools: -Discussed with patient abdomen CT revealed distended gallbladder and cholelithiasis which he was unaware of. -Will place referral to gastroenterology for further evaluation including loose stools.  No orders of the defined types were placed in this encounter.   Follow-up: Return for CPE and FBW in 5-6 months; nurse visit next week for TB skin test.   Note:  This note was prepared with assistance of Dragon voice recognition software. Occasional wrong-word or sound-a-like substitutions may have occurred due to the inherent limitations of voice recognition software.  Mayer MaskerMaritza Andrus Sharp, PA-C

## 2020-06-06 ENCOUNTER — Ambulatory Visit (INDEPENDENT_AMBULATORY_CARE_PROVIDER_SITE_OTHER): Payer: 59 | Admitting: Physician Assistant

## 2020-06-06 ENCOUNTER — Other Ambulatory Visit: Payer: Self-pay

## 2020-06-06 VITALS — BP 104/72 | HR 98 | Ht 69.0 in | Wt 163.8 lb

## 2020-06-06 DIAGNOSIS — Z111 Encounter for screening for respiratory tuberculosis: Secondary | ICD-10-CM | POA: Diagnosis not present

## 2020-06-06 NOTE — Progress Notes (Signed)
PPD placed in L forarm. Patient is aware to come back for read in 48-72 hrs.    AS, CMA

## 2020-06-08 ENCOUNTER — Other Ambulatory Visit: Payer: Self-pay

## 2020-06-08 ENCOUNTER — Ambulatory Visit: Payer: 59 | Admitting: Physician Assistant

## 2020-06-08 DIAGNOSIS — S36113A Laceration of liver, unspecified degree, initial encounter: Secondary | ICD-10-CM | POA: Diagnosis not present

## 2020-06-08 LAB — TB SKIN TEST
Induration: 0 mm
TB Skin Test: NEGATIVE

## 2020-06-09 LAB — COMPREHENSIVE METABOLIC PANEL
ALT: 28 IU/L (ref 0–44)
AST: 17 IU/L (ref 0–40)
Albumin/Globulin Ratio: 2 (ref 1.2–2.2)
Albumin: 4.4 g/dL (ref 4.0–5.0)
Alkaline Phosphatase: 156 IU/L — ABNORMAL HIGH (ref 44–121)
BUN/Creatinine Ratio: 19 (ref 9–20)
BUN: 18 mg/dL (ref 6–24)
Bilirubin Total: 0.3 mg/dL (ref 0.0–1.2)
CO2: 24 mmol/L (ref 20–29)
Calcium: 9.2 mg/dL (ref 8.7–10.2)
Chloride: 104 mmol/L (ref 96–106)
Creatinine, Ser: 0.96 mg/dL (ref 0.76–1.27)
GFR calc Af Amer: 113 mL/min/{1.73_m2} (ref >59)
GFR calc non Af Amer: 98 mL/min/{1.73_m2} (ref >59)
Globulin, Total: 2.2 g/dL (ref 1.5–4.5)
Glucose: 89 mg/dL (ref 65–99)
Potassium: 4.1 mmol/L (ref 3.5–5.2)
Sodium: 141 mmol/L (ref 134–144)
Total Protein: 6.6 g/dL (ref 6.0–8.5)

## 2020-06-09 NOTE — Progress Notes (Signed)
Labs  PPD negative. Ordered CMP to re-evaluate liver function. Mayer Masker, PA-C

## 2020-08-03 ENCOUNTER — Encounter: Payer: Self-pay | Admitting: Pulmonary Disease

## 2020-08-03 ENCOUNTER — Ambulatory Visit: Payer: 59 | Admitting: Pulmonary Disease

## 2020-08-03 ENCOUNTER — Other Ambulatory Visit: Payer: Self-pay

## 2020-08-03 VITALS — BP 110/66 | HR 82 | Temp 97.6°F | Ht 69.0 in | Wt 162.4 lb

## 2020-08-03 DIAGNOSIS — F17200 Nicotine dependence, unspecified, uncomplicated: Secondary | ICD-10-CM | POA: Diagnosis not present

## 2020-08-03 DIAGNOSIS — Z716 Tobacco abuse counseling: Secondary | ICD-10-CM | POA: Diagnosis not present

## 2020-08-03 DIAGNOSIS — R918 Other nonspecific abnormal finding of lung field: Secondary | ICD-10-CM

## 2020-08-03 NOTE — Progress Notes (Signed)
Synopsis: Referred in December 2021 for abnormal CT Chest by Mayer Masker, PA-C  Subjective:   PATIENT ID: Seth Rios GENDER: male DOB: October 27, 1980, MRN: [id number]  Chief Complaint  Patient presents with  . Consult    Abnormal cxr    This is a 39 year old gentleman, current smoker, 1 pack a day for the past 20+ years, asthma in childhood.  Does not vape.  He had a accident at work which led to CT imaging in the emergency department in September.  The CT imaging revealed multiple small 5 mm scattered nodules that spare the costophrenic angles.  Patient was referred today for evaluation of these lung nodules.  From a respiratory standpoint he has no complaints.  He works in a [employer].  No significant exposures.  Denies fevers chills night sweats weight loss.  Denies hemoptysis.  He has tried to smoke before and use nicotine patches.  Use no medications in the past.   Past Medical History:  Diagnosis Date  . Asthma    as a child  . Asthma    childhood asthma  . Liver laceration 04/29/2020     Family History  Problem Relation Age of Onset  . Skin cancer Father   . Diabetes Father   . Skin cancer Brother   . Prostate cancer Paternal Uncle   . Stroke Maternal Grandfather   . Alzheimer's disease Maternal Grandfather   . Parkinson's disease Maternal Grandfather   . Pneumonia Maternal Grandfather   . High Cholesterol Other   . Hypertension Other   . Heart disease Mother   . Diabetes Mother   . Alcohol abuse Paternal Grandfather   . Emphysema Paternal Grandfather        smoker     Past Surgical History:  Procedure Laterality Date  . WISDOM TOOTH EXTRACTION      Social History   Socioeconomic History  . Marital status: Married    Spouse name: Not on file  . Number of children: 0  . Years of education: Not on file  . Highest education level: Not on file  Occupational History  . 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  Tobacco Use  . Smoking status: Current Every  Day Smoker    Packs/day: 1.00    Years: 20.00    Pack years: 20.00    Types: Cigarettes  . Smokeless tobacco: Never Used  Vaping Use  . Vaping Use: Never used  Substance and Sexual Activity  . Alcohol use: Yes    Comment: rare  . Drug use: Yes    Types: Marijuana  . Sexual activity: Not Currently  Other Topics Concern  . Not on file  Social History Narrative   ** Merged History Encounter **       Social Determinants of Health   Financial Resource Strain: Not on file  Food Insecurity: Not on file  Transportation Needs: Not on file  Physical Activity: Not on file  Stress: Not on file  Social Connections: Not on file  Intimate Partner Violence: Not on file     No Known Allergies   Outpatient Medications Prior to Visit  Medication Sig Dispense Refill  . acetaminophen (TYLENOL) 325 MG tablet Take 2 tablets (650 mg total) by mouth every 6 (six) hours as needed.    Marland Kitchen ibuprofen (ADVIL) 200 MG tablet Take 1-3 tablets (200-600 mg total) by mouth 3 (three) times daily.    Marland Kitchen Phenylephrine-DM-GG-APAP (TYLENOL COLD/FLU SEVERE) 5-10-200-325 MG TABS Take 2 tablets by mouth every 6 (six)  hours as needed (cold symptoms).     No facility-administered medications prior to visit.    Review of Systems  Constitutional: Negative for chills, fever, malaise/fatigue and weight loss.  HENT: Negative for hearing loss, sore throat and tinnitus.   Eyes: Negative for blurred vision and double vision.  Respiratory: Negative for cough, hemoptysis, sputum production, shortness of breath, wheezing and stridor.   Cardiovascular: Negative for chest pain, palpitations, orthopnea, leg swelling and PND.  Gastrointestinal: Negative for abdominal pain, constipation, diarrhea, heartburn, nausea and vomiting.  Genitourinary: Negative for dysuria, hematuria and urgency.  Musculoskeletal: Negative for joint pain and myalgias.  Skin: Negative for itching and rash.  Neurological: Negative for dizziness, tingling,  weakness and headaches.  Endo/Heme/Allergies: Negative for environmental allergies. Does not bruise/bleed easily.  Psychiatric/Behavioral: Negative for depression. The patient is not nervous/anxious and does not have insomnia.   All other systems reviewed and are negative.    Objective:  Physical Exam Vitals reviewed.  Constitutional:      General: He is not in acute distress.    Appearance: He is well-developed and well-nourished.  HENT:     Head: Normocephalic and atraumatic.     Mouth/Throat:     Mouth: Oropharynx is clear and moist.  Eyes:     General: No scleral icterus.    Conjunctiva/sclera: Conjunctivae normal.     Pupils: Pupils are equal, round, and reactive to light.  Neck:     Vascular: No JVD.     Trachea: No tracheal deviation.  Cardiovascular:     Rate and Rhythm: Normal rate and regular rhythm.     Pulses: Intact distal pulses.     Heart sounds: Normal heart sounds. No murmur heard.   Pulmonary:     Effort: Pulmonary effort is normal. No tachypnea, accessory muscle usage or respiratory distress.     Breath sounds: Normal breath sounds. No stridor. No wheezing, rhonchi or rales.  Abdominal:     General: Bowel sounds are normal. There is no distension.     Palpations: Abdomen is soft.     Tenderness: There is no abdominal tenderness.  Musculoskeletal:        General: No tenderness or edema.     Cervical back: Neck supple.  Lymphadenopathy:     Cervical: No cervical adenopathy.  Skin:    General: Skin is warm and dry.     Capillary Refill: Capillary refill takes less than 2 seconds.     Findings: No rash.  Neurological:     Mental Status: He is alert and oriented to person, place, and time.  Psychiatric:        Mood and Affect: Mood and affect normal.        Behavior: Behavior normal.      Vitals:   08/03/20 1501  BP: 110/66  Pulse: 82  Temp: 97.6 F (36.4 C)  TempSrc: Tympanic  SpO2: 96%  Weight: 162 lb 6 oz (73.7 kg)  Height: 5\' 9"  (1.753  m)   96% on RA BMI Readings from Last 3 Encounters:  08/03/20 23.98 kg/m  06/06/20 24.19 kg/m  06/02/20 24.13 kg/m   Wt Readings from Last 3 Encounters:  08/03/20 162 lb 6 oz (73.7 kg)  06/06/20 163 lb 12.8 oz (74.3 kg)  06/02/20 163 lb 6.4 oz (74.1 kg)     CBC    Component Value Date/Time   WBC 10.8 (H) 04/30/2020 0600   RBC 4.28 04/30/2020 0600   HGB 14.1 04/30/2020 0600  HGB 17.0 03/24/2019 0928   HCT 41.0 04/30/2020 0600   HCT 48.4 03/24/2019 0928   PLT 189 04/30/2020 0600   PLT 217 03/24/2019 0928   MCV 95.8 04/30/2020 0600   MCV 94 03/24/2019 0928   MCH 32.9 04/30/2020 0600   MCHC 34.4 04/30/2020 0600   RDW 12.0 04/30/2020 0600   RDW 11.9 03/24/2019 0928   LYMPHSABS 1.7 03/24/2019 0928   EOSABS 0.1 03/24/2019 0928   BASOSABS 0.0 03/24/2019 0928     Chest Imaging: September 2021 CT chest: Multiple innumerable 5 mm scattered pulmonary nodules in various stages, some with small early areas of cavitation/cyst formation. The patient's images have been independently reviewed by me.    Pulmonary Functions Testing Results: No flowsheet data found.  FeNO:   Pathology:   Echocardiogram:   Heart Catheterization:     Assessment & Plan:     ICD-10-CM   1. Multiple nodules of lung  R91.8 CT CHEST HIGH RESOLUTION  2. Current smoker  F17.200   3. Abnormal CT scan of lung  R91.8   4. Encounter for smoking cessation counseling  Z71.6     Discussion:  This is a 39 year old male with an abnormal CT small innumerable small 5 mm pulmonary nodules with various stages, scattered small cystic/early cavitation within several.  It does spare the costophrenic angles.  I suspect this is an inflammatory etiology.  Differential diagnosis would include lesions like Langerhans cell histiocytosis, postinflammatory lesions, vasculitis, atypical infections.  Neoplasm would remain on the differential but however less likely.  Plan: I think next best step would be to obtain  repeat chest imaging to see if the nodules are even still there or have resolved or persistent. If they are persistent or growing in size would consider neck steps to include autoimmune work-up with labs and potentially bronchoscopy for biopsy if needed. However I would prefer the patient quit smoking to see if the lesions resolve.  Please see separate smoking cessation counseling as documented separately.    Current Outpatient Medications:  .  acetaminophen (TYLENOL) 325 MG tablet, Take 2 tablets (650 mg total) by mouth every 6 (six) hours as needed., Disp: , Rfl:  .  ibuprofen (ADVIL) 200 MG tablet, Take 1-3 tablets (200-600 mg total) by mouth 3 (three) times daily., Disp: , Rfl:  .  Phenylephrine-DM-GG-APAP (TYLENOL COLD/FLU SEVERE) 5-10-200-325 MG TABS, Take 2 tablets by mouth every 6 (six) hours as needed (cold symptoms)., Disp: , Rfl:   I spent 62 minutes dedicated to the care of this patient on the date of this encounter to include pre-visit review of records, face-to-face time with the patient discussing conditions above, post visit ordering of testing, clinical documentation with the electronic health record, making appropriate referrals as documented, and communicating necessary findings to members of the patients care team.   Josephine Igo, DO [facility] 08/03/2020 3:34 PM

## 2020-08-03 NOTE — Patient Instructions (Addendum)
Thank you for visiting Dr. Tonia Brooms at [facility]. Today we recommend the following:  Orders Placed This Encounter  Procedures   CT CHEST HIGH RESOLUTION   Return in about 4 weeks (around 08/31/2020) for with APP or Dr. Tonia Brooms. We will see you after the CT is complete     Please do your part to reduce the spread of COVID-19.   You must quit smoking or vaping. This is the single most important thing that you can do to improve your lung health.   S = Set a quit date. T = Tell family, friends, and the people around you that you plan to quit. A = Anticipate or plan ahead for the tough times you'll face while quitting. R = Remove cigarettes and other tobacco products from your home, car, and work T = Talk to [place] about getting help to quit  If you need help feel free to reach out to our office, [facility] Smoking Cessation Class: [phone], call 1-800-QUIT-NOW, or visit [email or web address].

## 2020-08-03 NOTE — Progress Notes (Signed)
Smoking Cessation Counseling:   The patient's current tobacco use: 1ppd for 20 years  The patient was advised to quit and impact of smoking on their health.  I assessed the patient's willingness to attempt to quit. I provided methods and skills for cessation. He has used patches in the past  We reviewed medication management of smoking session drugs if appropriate. Resources to help quit smoking were provided. A smoking cessation quit date was set: May 4th Follow-up was arranged in our clinic.  The amount of time spent counseling patient was 6 mins.  Josephine Igo, DO [facility] 08/03/2020 3:42 PM

## 2020-08-10 ENCOUNTER — Ambulatory Visit (INDEPENDENT_AMBULATORY_CARE_PROVIDER_SITE_OTHER)
Admission: RE | Admit: 2020-08-10 | Discharge: 2020-08-10 | Disposition: A | Discharge status: Home / Self Care | Payer: 59 | Source: Ambulatory Visit | Attending: Pulmonary Disease | Admitting: Pulmonary Disease

## 2020-08-10 ENCOUNTER — Other Ambulatory Visit: Payer: Self-pay

## 2020-08-10 DIAGNOSIS — R918 Other nonspecific abnormal finding of lung field: Secondary | ICD-10-CM | POA: Diagnosis not present

## 2020-09-05 ENCOUNTER — Ambulatory Visit: Payer: 59 | Admitting: Pulmonary Disease

## 2020-09-05 ENCOUNTER — Other Ambulatory Visit: Payer: Self-pay

## 2020-09-05 ENCOUNTER — Encounter: Payer: Self-pay | Admitting: Pulmonary Disease

## 2020-09-05 VITALS — BP 100/72 | HR 80 | Temp 97.6°F | Ht 69.0 in | Wt 165.5 lb

## 2020-09-05 DIAGNOSIS — K8020 Calculus of gallbladder without cholecystitis without obstruction: Secondary | ICD-10-CM

## 2020-09-05 DIAGNOSIS — F17210 Nicotine dependence, cigarettes, uncomplicated: Secondary | ICD-10-CM

## 2020-09-05 DIAGNOSIS — J069 Acute upper respiratory infection, unspecified: Secondary | ICD-10-CM | POA: Diagnosis not present

## 2020-09-05 DIAGNOSIS — R918 Other nonspecific abnormal finding of lung field: Secondary | ICD-10-CM

## 2020-09-05 DIAGNOSIS — R062 Wheezing: Secondary | ICD-10-CM | POA: Diagnosis not present

## 2020-09-05 MED ORDER — PREDNISONE 20 MG PO TABS: 40.0000 mg | ORAL_TABLET | Freq: Every day | ORAL | 0 refills | Status: AC

## 2020-09-05 NOTE — Patient Instructions (Addendum)
Thank you for visiting Dr. Tonia Brooms at [facility]. Today we recommend the following:  Orders Placed This Encounter  Procedures  . CT Chest Wo Contrast  . Ambulatory referral to General Surgery   Ct to be scheduled in 9 months.   Meds ordered this encounter  Medications  . predniSONE (DELTASONE) 20 MG tablet    Sig: Take 2 tablets (40 mg total) by mouth daily with breakfast for 5 days.    Dispense:  10 tablet    Refill:  0   Return in about 9 months (around 06/05/2021) for with APP or Dr. Tonia Brooms.    Please do your part to reduce the spread of COVID-19.    You must quit smoking or vaping. This is the single most important thing that you can do to improve your lung health.   S = Set a quit date. T = Tell family, friends, and the people around you that you plan to quit. A = Anticipate or plan ahead for the tough times you'll face while quitting. R = Remove cigarettes and other tobacco products from your home, car, and work T = Talk to [place] about getting help to quit  If you need help feel free to reach out to our office, [facility] Smoking Cessation Class: [phone], call 1-800-QUIT-NOW, or visit [email or web address].

## 2020-09-05 NOTE — Progress Notes (Signed)
Smoking Cessation Counseling:   The patient's current tobacco use: 0.5 ppd The patient was advised to quit and impact of smoking on their health.  I assessed the patient's willingness to attempt to quit. I provided methods and skills for cessation. We reviewed medication management of smoking session drugs if appropriate. Resources to help quit smoking were provided. A smoking cessation quit date was set: May 4th Follow-up was arranged in our clinic.  The amount of time spent counseling patient was 4 mins.   Josephine Igo, DO Section Pulmonary Critical Care 09/05/2020 5:00 PM

## 2020-09-05 NOTE — Progress Notes (Signed)
Synopsis: Referred in December 2021 for abnormal CT Chest by Mayer Masker, PA-C  Subjective:   PATIENT ID: Seth Fabian GENDER: male DOB: 08-16-1980, MRN: [id number]  Chief Complaint  Patient presents with  . Follow-up    Pt here for 4 week follow up.  Had his Ct scan on 08/10/20    This is a 40 year old gentleman, current smoker, 1 pack a day for the past 20+ years, asthma in childhood.  Does not vape.  He had a accident at work which led to CT imaging in the emergency department in September.  The CT imaging revealed multiple small 5 mm scattered nodules that spare the costophrenic angles.  Patient was referred today for evaluation of these lung nodules.  From a respiratory standpoint he has no complaints.  He works in a [employer].  No significant exposures.  Denies fevers chills night sweats weight loss.  Denies hemoptysis.  He has tried to smoke before and use nicotine patches.  Use no medications in the past.  OV 09/05/2020: 40 year old gentleman, current smoker down to half pack per day.  Here for follow-up after recent noncontrasted CT of the chest for evaluation of multiple pulmonary nodules.  There is areas with small areas of cavitation.  Case was discussed with interstitial lung disease clinic director.  Felt to be inflammatory potentially smoking-related care.  Differential diagnosis includes histiocytosis.  Patient denies hemoptysis, weight loss nausea vomiting diarrhea.  Unfortunately he still is smoking about half pack a day.  Recently however patient developed URI symptoms.  He has still been struggling for this for the past couple of days as now he has chest tightness.  Occasional wheezing.  This is new over the past few days.   Past Medical History:  Diagnosis Date  . Asthma    as a child  . Asthma    childhood asthma  . Liver laceration 04/29/2020     Family History  Problem Relation Age of Onset  . Skin cancer Father   . Diabetes Father   . Skin cancer Brother   .  Prostate cancer Paternal Uncle   . Stroke Maternal Grandfather   . Alzheimer's disease Maternal Grandfather   . Parkinson's disease Maternal Grandfather   . Pneumonia Maternal Grandfather   . High Cholesterol Other   . Hypertension Other   . Heart disease Mother   . Diabetes Mother   . Alcohol abuse Paternal Grandfather   . Emphysema Paternal Grandfather        smoker     Past Surgical History:  Procedure Laterality Date  . WISDOM TOOTH EXTRACTION      Social History   Socioeconomic History  . Marital status: Married    Spouse name: Not on file  . Number of children: 0  . Years of education: Not on file  . Highest education level: Not on file  Occupational History  . 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  Tobacco Use  . Smoking status: Current Every Day Smoker    Packs/day: 1.00    Years: 20.00    Pack years: 20.00    Types: Cigarettes  . Smokeless tobacco: Never Used  Vaping Use  . Vaping Use: Never used  Substance and Sexual Activity  . Alcohol use: Yes    Comment: rare  . Drug use: Yes    Types: Marijuana  . Sexual activity: Not Currently  Other Topics Concern  . Not on file  Social History Narrative   ** Merged History Encounter **  Social Determinants of Health   Financial Resource Strain: Not on file  Food Insecurity: Not on file  Transportation Needs: Not on file  Physical Activity: Not on file  Stress: Not on file  Social Connections: Not on file  Intimate Partner Violence: Not on file     No Known Allergies   Outpatient Medications Prior to Visit  Medication Sig Dispense Refill  . acetaminophen (TYLENOL) 325 MG tablet Take 2 tablets (650 mg total) by mouth every 6 (six) hours as needed.    Marland Kitchen ibuprofen (ADVIL) 200 MG tablet Take 1-3 tablets (200-600 mg total) by mouth 3 (three) times daily.    Marland Kitchen Phenylephrine-DM-GG-APAP (TYLENOL COLD/FLU SEVERE) 5-10-200-325 MG TABS Take 2 tablets by mouth every 6 (six) hours as needed (cold symptoms).      No facility-administered medications prior to visit.    Review of Systems  Constitutional: Negative for chills, fever, malaise/fatigue and weight loss.  HENT: Negative for hearing loss, sore throat and tinnitus.   Eyes: Negative for blurred vision and double vision.  Respiratory: Positive for shortness of breath. Negative for cough, hemoptysis, sputum production, wheezing and stridor.   Cardiovascular: Negative for chest pain, palpitations, orthopnea, leg swelling and PND.  Gastrointestinal: Negative for abdominal pain, constipation, diarrhea, heartburn, nausea and vomiting.  Genitourinary: Negative for dysuria, hematuria and urgency.  Musculoskeletal: Negative for joint pain and myalgias.  Skin: Negative for itching and rash.  Neurological: Negative for dizziness, tingling, weakness and headaches.  Endo/Heme/Allergies: Negative for environmental allergies. Does not bruise/bleed easily.  Psychiatric/Behavioral: Negative for depression. The patient is not nervous/anxious and does not have insomnia.   All other systems reviewed and are negative.    Objective:  Physical Exam Vitals reviewed.  Constitutional:      General: He is not in acute distress.    Appearance: He is well-developed and well-nourished.  HENT:     Head: Normocephalic and atraumatic.     Mouth/Throat:     Mouth: Oropharynx is clear and moist.  Eyes:     General: No scleral icterus.    Conjunctiva/sclera: Conjunctivae normal.     Pupils: Pupils are equal, round, and reactive to light.  Neck:     Vascular: No JVD.     Trachea: No tracheal deviation.  Cardiovascular:     Rate and Rhythm: Normal rate and regular rhythm.     Pulses: Intact distal pulses.     Heart sounds: Normal heart sounds. No murmur heard.   Pulmonary:     Effort: Pulmonary effort is normal. No tachypnea, accessory muscle usage or respiratory distress.     Breath sounds: No stridor. Wheezing present. No rhonchi or rales.  Abdominal:      General: Bowel sounds are normal. There is no distension.     Palpations: Abdomen is soft.     Tenderness: There is no abdominal tenderness.  Musculoskeletal:        General: No tenderness or edema.     Cervical back: Neck supple.  Lymphadenopathy:     Cervical: No cervical adenopathy.  Skin:    General: Skin is warm and dry.     Capillary Refill: Capillary refill takes less than 2 seconds.     Findings: No rash.  Neurological:     Mental Status: He is alert and oriented to person, place, and time.  Psychiatric:        Mood and Affect: Mood and affect normal.        Behavior: Behavior normal.  Vitals:   09/05/20 1638  BP: 100/72  Pulse: 80  Temp: 97.6 F (36.4 C)  TempSrc: Tympanic  SpO2: 97%  Weight: 165 lb 8 oz (75.1 kg)  Height: 5\' 9"  (1.753 m)   97% on RA BMI Readings from Last 3 Encounters:  09/05/20 24.44 kg/m  08/03/20 23.98 kg/m  06/06/20 24.19 kg/m   Wt Readings from Last 3 Encounters:  09/05/20 165 lb 8 oz (75.1 kg)  08/03/20 162 lb 6 oz (73.7 kg)  06/06/20 163 lb 12.8 oz (74.3 kg)     CBC    Component Value Date/Time   WBC 10.8 (H) 04/30/2020 0600   RBC 4.28 04/30/2020 0600   HGB 14.1 04/30/2020 0600   HGB 17.0 03/24/2019 0928   HCT 41.0 04/30/2020 0600   HCT 48.4 03/24/2019 0928   PLT 189 04/30/2020 0600   PLT 217 03/24/2019 0928   MCV 95.8 04/30/2020 0600   MCV 94 03/24/2019 0928   MCH 32.9 04/30/2020 0600   MCHC 34.4 04/30/2020 0600   RDW 12.0 04/30/2020 0600   RDW 11.9 03/24/2019 0928   LYMPHSABS 1.7 03/24/2019 0928   EOSABS 0.1 03/24/2019 0928   BASOSABS 0.0 03/24/2019 0928     Chest Imaging: September 2021 CT chest: Multiple innumerable 5 mm scattered pulmonary nodules in various stages, some with small early areas of cavitation/cyst formation. The patient's images have been independently reviewed by me.    HRCT chest 08/10/2020: Reviewed today in the office with patient.  Multiple small bilateral pulmonary nodules in  various stages.  Sent with small cavities.  Presumed inflammatory etiology versus a vasculitis.  I think that with his known smoking history young Caucasian male could consider diagnosis of Langerhans cell or smoking-related ILD.  He also has cholelithiasis. The patient's images have been independently reviewed by me.    Pulmonary Functions Testing Results: No flowsheet data found.  FeNO:   Pathology:   Echocardiogram:   Heart Catheterization:     Assessment & Plan:     ICD-10-CM   1. Multiple lung nodules  R91.8 CT Chest Wo Contrast    Ambulatory referral to General Surgery    CANCELED: CT Chest Wo Contrast  2. Gallstones  K80.20 Ambulatory referral to General Surgery  3. Upper respiratory tract infection, unspecified type  J06.9   4. Wheezing  R06.2     Discussion:  40 year old gentleman, abnormal CT, small innumerable, lung nodules scattered upper lobe predominant sparing the costophrenic angles differential diagnosis of likely Langerhans cell histiocytosis versus inflammatory lesions versus atypical inflection.  The nodules do not appear to be worse or larger than where they were on the previous imaging.  I believe that it is pertinent that we like to hold off on additional lab work to determine if this is underlying vasculitis which I think is low probability as he has no other significant symptoms.  And I think the best step is for him to stop smoking to see if these resolve on his own if this is a smoking-related ILD/parenchymal lung disease.  Also found to have cholelithiasis on his gallbladder.  Would like to see a surgeon at some point about this.  I will place referral.  As for his ongoing URI symptoms this is not getting any better he does not feel sick any longer no fevers but he still having occasional wheezing.  Plan: Treat for exacerbation with prednisone.  40 mg x 5 days As for the patient's lung nodule repeat noncontrasted CT of the  chest in 9 months. Patient was  counseled on smoking cessation given various options for quitting.  Please see separate documentation.    Current Outpatient Medications:  .  acetaminophen (TYLENOL) 325 MG tablet, Take 2 tablets (650 mg total) by mouth every 6 (six) hours as needed., Disp: , Rfl:  .  ibuprofen (ADVIL) 200 MG tablet, Take 1-3 tablets (200-600 mg total) by mouth 3 (three) times daily., Disp: , Rfl:  .  Phenylephrine-DM-GG-APAP (TYLENOL COLD/FLU SEVERE) 5-10-200-325 MG TABS, Take 2 tablets by mouth every 6 (six) hours as needed (cold symptoms)., Disp: , Rfl:    Josephine Igo, DO [facility] 09/05/2020 4:48 PM

## 2020-09-16 ENCOUNTER — Encounter: Payer: Self-pay | Admitting: Pulmonary Disease

## 2020-10-07 ENCOUNTER — Other Ambulatory Visit: Payer: Self-pay | Admitting: General Surgery

## 2020-10-07 DIAGNOSIS — K8020 Calculus of gallbladder without cholecystitis without obstruction: Secondary | ICD-10-CM

## 2020-10-21 ENCOUNTER — Other Ambulatory Visit: Payer: Self-pay | Admitting: Physician Assistant

## 2020-10-21 ENCOUNTER — Ambulatory Visit
Admission: RE | Admit: 2020-10-21 | Discharge: 2020-10-21 | Disposition: A | Discharge status: Home / Self Care | Payer: 59 | Source: Ambulatory Visit | Attending: General Surgery | Admitting: General Surgery

## 2020-10-21 DIAGNOSIS — E786 Lipoprotein deficiency: Secondary | ICD-10-CM

## 2020-10-21 DIAGNOSIS — Z0000 Encounter for general adult medical examination without abnormal findings: Secondary | ICD-10-CM

## 2020-10-21 DIAGNOSIS — E559 Vitamin D deficiency, unspecified: Secondary | ICD-10-CM

## 2020-10-21 DIAGNOSIS — K8020 Calculus of gallbladder without cholecystitis without obstruction: Secondary | ICD-10-CM

## 2020-10-27 ENCOUNTER — Other Ambulatory Visit: Payer: 59

## 2020-10-27 ENCOUNTER — Other Ambulatory Visit: Payer: Self-pay

## 2020-10-27 DIAGNOSIS — Z0000 Encounter for general adult medical examination without abnormal findings: Secondary | ICD-10-CM

## 2020-10-27 DIAGNOSIS — E559 Vitamin D deficiency, unspecified: Secondary | ICD-10-CM

## 2020-10-27 DIAGNOSIS — E786 Lipoprotein deficiency: Secondary | ICD-10-CM

## 2020-10-27 NOTE — Addendum Note (Signed)
Addended by: Sylvester Harder on: 10/27/2020 08:35 AM   Modules accepted: Orders

## 2020-10-28 LAB — CBC
Hematocrit: 46.1 % (ref 37.5–51.0)
Hemoglobin: 16.6 g/dL (ref 13.0–17.7)
MCH: 33.8 pg — ABNORMAL HIGH (ref 26.6–33.0)
MCHC: 36 g/dL — ABNORMAL HIGH (ref 31.5–35.7)
MCV: 94 fL (ref 79–97)
Platelets: 210 10*3/uL (ref 150–450)
RBC: 4.91 x10E6/uL (ref 4.14–5.80)
RDW: 11.7 % (ref 11.6–15.4)
WBC: 7.5 10*3/uL (ref 3.4–10.8)

## 2020-10-28 LAB — COMPREHENSIVE METABOLIC PANEL
ALT: 20 IU/L (ref 0–44)
AST: 19 IU/L (ref 0–40)
Albumin/Globulin Ratio: 2.2 (ref 1.2–2.2)
Albumin: 4.7 g/dL (ref 4.0–5.0)
Alkaline Phosphatase: 124 IU/L — ABNORMAL HIGH (ref 44–121)
BUN/Creatinine Ratio: 19 (ref 9–20)
BUN: 18 mg/dL (ref 6–24)
Bilirubin Total: 0.5 mg/dL (ref 0.0–1.2)
CO2: 22 mmol/L (ref 20–29)
Calcium: 9.5 mg/dL (ref 8.7–10.2)
Chloride: 99 mmol/L (ref 96–106)
Creatinine, Ser: 0.97 mg/dL (ref 0.76–1.27)
Globulin, Total: 2.1 g/dL (ref 1.5–4.5)
Glucose: 92 mg/dL (ref 65–99)
Potassium: 4.8 mmol/L (ref 3.5–5.2)
Sodium: 142 mmol/L (ref 134–144)
Total Protein: 6.8 g/dL (ref 6.0–8.5)
eGFR: 101 mL/min/{1.73_m2} (ref >59)

## 2020-10-28 LAB — LIPID PANEL
Chol/HDL Ratio: 5.5 ratio — ABNORMAL HIGH (ref 0.0–5.0)
Cholesterol, Total: 215 mg/dL — ABNORMAL HIGH (ref 100–199)
HDL: 39 mg/dL — ABNORMAL LOW (ref >39)
LDL Chol Calc (NIH): 148 mg/dL — ABNORMAL HIGH (ref 0–99)
Triglycerides: 154 mg/dL — ABNORMAL HIGH (ref 0–149)
VLDL Cholesterol Cal: 28 mg/dL (ref 5–40)

## 2020-10-28 LAB — HEMOGLOBIN A1C
Est. average glucose Bld gHb Est-mCnc: 126 mg/dL
Hgb A1c MFr Bld: 6 % — ABNORMAL HIGH (ref 4.8–5.6)

## 2020-10-28 LAB — VITAMIN D 25 HYDROXY (VIT D DEFICIENCY, FRACTURES): Vit D, 25-Hydroxy: 14.2 ng/mL — ABNORMAL LOW (ref 30.0–100.0)

## 2020-10-28 LAB — TSH: TSH: 1.71 u[IU]/mL (ref 0.450–4.500)

## 2020-10-31 ENCOUNTER — Encounter: Payer: Self-pay | Admitting: Physician Assistant

## 2020-10-31 ENCOUNTER — Ambulatory Visit (INDEPENDENT_AMBULATORY_CARE_PROVIDER_SITE_OTHER): Payer: 59 | Admitting: Physician Assistant

## 2020-10-31 ENCOUNTER — Other Ambulatory Visit: Payer: Self-pay | Admitting: General Surgery

## 2020-10-31 ENCOUNTER — Other Ambulatory Visit: Payer: Self-pay

## 2020-10-31 VITALS — BP 110/77 | HR 94 | Temp 97.6°F | Ht 69.0 in | Wt 169.4 lb

## 2020-10-31 DIAGNOSIS — Z23 Encounter for immunization: Secondary | ICD-10-CM

## 2020-10-31 DIAGNOSIS — Z0000 Encounter for general adult medical examination without abnormal findings: Secondary | ICD-10-CM | POA: Diagnosis not present

## 2020-10-31 DIAGNOSIS — R7302 Impaired glucose tolerance (oral): Secondary | ICD-10-CM

## 2020-10-31 DIAGNOSIS — Z716 Tobacco abuse counseling: Secondary | ICD-10-CM

## 2020-10-31 DIAGNOSIS — Z1159 Encounter for screening for other viral diseases: Secondary | ICD-10-CM

## 2020-10-31 DIAGNOSIS — E785 Hyperlipidemia, unspecified: Secondary | ICD-10-CM

## 2020-10-31 DIAGNOSIS — E559 Vitamin D deficiency, unspecified: Secondary | ICD-10-CM

## 2020-10-31 MED ORDER — INDOCYANINE GREEN 25 MG IV SOLR: 2.5000 mg | Freq: Once | INTRAVENOUS | Status: AC

## 2020-10-31 MED ORDER — VITAMIN D (ERGOCALCIFEROL) 1.25 MG (50000 UNIT) PO CAPS: 50000.0000 [IU] | ORAL_CAPSULE | ORAL | 0 refills | Status: DC

## 2020-10-31 NOTE — Progress Notes (Signed)
Male physical   Impression and Recommendations:    1. Healthcare maintenance   2. Need for diphtheria-tetanus-pertussis (Tdap) vaccine   3. Encounter for hepatitis C screening test for low risk patient   4. Glucose intolerance (impaired glucose tolerance)   5. Hyperlipidemia, unspecified hyperlipidemia type   6. Vitamin D deficiency   7. Encounter for tobacco use cessation counseling      1) Anticipatory Guidance: Skin CA prevention- recommend to use sunscreen when outside along with skin surveillance; eating a balanced and modest diet; physical activity at least 25 minutes per day or minimum of 150 min/ week moderate to intense activity.  2) Immunizations / Screenings / Labs:   All immunizations are up-to-date per recommendations or will be updated today if pt allows.    - Patient understands with dental and vision screens they will schedule independently.  - Obtained CBC, CMP, HgA1c, Lipid panel, TSH and vit D when fasting. Most labs are essentially within normal limits or stable from prior with the exception of lipid panel, A1c and Vitamin D. Patient prefers to continue with diet and lifestyle modifications before considering statin therapy. - Patient agreeable to Hep C screening and Tdap. Has completed Covid vaccine series.  3) Weight:  Recommend to improve diet habits to improve overall feelings of well being and objective health data. Improve nutrient density of diet through increasing intake of fruits and vegetables and decreasing saturated fats, white flour products and refined sugars.   4) Healthcare Maintenance: -Recommend to continue to reduce tobacco use and eventually quit. -Follow a heart healthy diet and reduce saturated and trans fats, stay well hydrated and increase physical activity. -Will send rx for weekly Vitamin D x 12 weeks then start taking Vit D3 2000 units daily. -Recommend repeating FBW (lipid panel, a1c, cmp cbc) in 6 months. -Follow up in 1 year for  CPE and FBW   Orders Placed This Encounter  Procedures  . Tdap vaccine greater than or equal to 7yo IM  . Hepatitis C Antibody    Meds ordered this encounter  Medications  . Vitamin D, Ergocalciferol, (DRISDOL) 1.25 MG (50000 UNIT) CAPS capsule    Sig: Take 1 capsule (50,000 Units total) by mouth every 7 (seven) days.    Dispense:  12 capsule    Refill:  0    Order Specific Question:   Supervising Provider    Answer:   Nani Gasser D [[id number]]     Return in about 1 year (around 10/31/2021) for CPE and FBW; lab visit in 6 months for lipid panel, a1c, cmp, cbc.     Gross side effects, risk and benefits, and alternatives of medications discussed with patient.  Patient is aware that all medications have potential side effects and we are unable to predict every side effect or drug-drug interaction that may occur.  Expresses verbal understanding and consents to current therapy plan and treatment regimen.  Please see AVS handed out to patient at the end of our visit for further patient instructions/ counseling done pertaining to today's office visit.      Subjective:       CC: CPE   HPI: Seth Rios is a 40 y.o. male who presents to [facility] today for a yearly health maintenance exam.     Health Maintenance Summary  - Reviewed and updated, unless pt declines services.  Last Cologuard or Colonoscopy:   N/A Family history of Colon CA:  N  Tobacco History Reviewed:  Y, current smoker about 1 PPD Abdominal Ultrasound:   N/A CT scan for screening lung CA:   N/A Alcohol / drug use:    No concerns, no use / no use Eye exams:Y Male history: STD concerns:   none Additional penile/ urinary concerns:     Additional concerns beyond Health Maintenance issues:  None     Immunization History  Administered Date(s) Administered  . Moderna Sars-Covid-2 Vaccination 11/04/2019, 12/02/2019  . PPD Test 06/06/2020  . Tdap 10/31/2020      Health Maintenance  Topic Date Due  . Hepatitis C Screening  Never done  . COVID-19 Vaccine (3 - Booster for Moderna series) 06/02/2020  . INFLUENZA VACCINE  11/03/2020 (Originally 03/06/2020)  . TETANUS/TDAP  11/01/2030  . HIV Screening  Completed  . HPV VACCINES  Aged Out       Wt Readings from Last 3 Encounters:  10/31/20 169 lb 6.4 oz (76.8 kg)  09/05/20 165 lb 8 oz (75.1 kg)  08/03/20 162 lb 6 oz (73.7 kg)   BP Readings from Last 3 Encounters:  10/31/20 110/77  09/05/20 100/72  08/03/20 110/66   Pulse Readings from Last 3 Encounters:  10/31/20 94  09/05/20 80  08/03/20 82    Patient Active Problem List   Diagnosis Date Noted  . Liver laceration 04/29/2020  . Low HDL (under 40) 04/01/2019  . 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 Marital stress 04/01/2019  . Low libido 04/01/2019  . Other fatigue 04/01/2019  . Vitamin D insufficiency 04/01/2019  . Glucose intolerance (impaired glucose tolerance) 04/01/2019  . Tremor of right hand 04/01/2019    Past Medical History:  Diagnosis Date  . Asthma    as a child  . Asthma    childhood asthma  . Liver laceration 04/29/2020    Past Surgical History:  Procedure Laterality Date  . WISDOM TOOTH EXTRACTION      Family History  Problem Relation Age of Onset  . Skin cancer Father   . Diabetes Father   . Skin cancer Brother   . Prostate cancer Paternal Uncle   . Stroke Maternal Grandfather   . Alzheimer's disease Maternal Grandfather   . Parkinson's disease Maternal Grandfather   . Pneumonia Maternal Grandfather   . High Cholesterol Other   . Hypertension Other   . Heart disease Mother   . Diabetes Mother   . Alcohol abuse Paternal Grandfather   . Emphysema Paternal Grandfather        smoker    Social History   Substance and Sexual Activity  Drug Use Yes  . Types: Marijuana  ,  Social History   Substance and Sexual Activity  Alcohol Use Yes   Comment: rare  ,  Social History   Tobacco  Use  Smoking Status Current Every Day Smoker  . Packs/day: 1.00  . Years: 20.00  . Pack years: 20.00  . Types: Cigarettes  Smokeless Tobacco Never Used  ,  Social History   Substance and Sexual Activity  Sexual Activity Not Currently    Patient's Medications  New Prescriptions   VITAMIN D, ERGOCALCIFEROL, (DRISDOL) 1.25 MG (50000 UNIT) CAPS CAPSULE    Take 1 capsule (50,000 Units total) by mouth every 7 (seven) days.  Previous Medications   ACETAMINOPHEN (TYLENOL) 325 MG TABLET    Take 2 tablets (650 mg total) by mouth every 6 (six) hours as needed.   IBUPROFEN (ADVIL) 200 MG TABLET    Take 1-3 tablets (200-600  mg total) by mouth 3 (three) times daily.   PHENYLEPHRINE-DM-GG-APAP (TYLENOL COLD/FLU SEVERE) 5-10-200-325 MG TABS    Take 2 tablets by mouth every 6 (six) hours as needed (cold symptoms).  Modified Medications   No medications on file  Discontinued Medications   No medications on file    Patient has no known allergies.  Review of Systems: General:   Denies fever, chills, unexplained weight loss.  Optho/Auditory:   Denies visual changes, blurred vision/LOV Respiratory:   Denies SOB, DOE more than baseline levels.   Cardiovascular:   Denies chest pain, palpitations, new onset peripheral edema  Gastrointestinal:   Denies nausea, vomiting, diarrhea.  Genitourinary: Denies dysuria, freq/ urgency, flank pain or discharge from genitals.  Endocrine:     Denies hot or cold intolerance, polyuria, polydipsia. Musculoskeletal:   Denies unexplained myalgias, joint swelling, unexplained arthralgias, gait problems.  Skin:  Denies rash, suspicious lesions Neurological:     Denies dizziness, unexplained weakness, numbness  Psychiatric/Behavioral:   Denies mood changes, suicidal or homicidal ideations, hallucinations    Objective:     Blood pressure 110/77, pulse 94, temperature 97.6 F (36.4 C), height 5\' 9"  (1.753 m), weight 169 lb 6.4 oz (76.8 kg), SpO2 98 %. Body mass  index is 25.02 kg/m. General Appearance:    Alert, cooperative, no distress, appears stated age  Head:    Normocephalic, without obvious abnormality, atraumatic  Eyes:    PERRL, conjunctiva/corneas clear, EOM's intact, both eyes  Ears:    Normal TM's and external ear canals, both ears  Nose:   Nares normal, septum mildly deviated, mucosa normal, no drainage    or sinus tenderness  Throat:   Lips w/o lesion, mucosa moist, and tongue normal; teeth and gums normal  Neck:   Supple, symmetrical, trachea midline, no adenopathy;    thyroid:  no enlargement/tenderness/nodules; no JVD  Back:     Symmetric, no curvature, ROM normal, no CVA tenderness  Lungs:     Clear to auscultation bilaterally, respirations unlabored, no  Wh/ R/ R  Chest Wall:    No tenderness or gross deformity; normal excursion   Heart:    Regular rate and rhythm, S1 and S2 normal, no murmur, rub   or gallop  Abdomen:     Soft, non-tender, bowel sounds active all four quadrants, No  G/R/R, no masses, no organomegaly  Genitalia:   Deferred. No concerns/sxs.  Rectal:   Deferred. No concerns/sxs.  Extremities:   Extremities normal, atraumatic, no cyanosis or gross edema  Pulses:   2+ and symmetric all extremities  Skin:   Warm, dry, Skin color, texture, turgor normal, no obvious rashes or lesions  M-Sk:   Ambulates * 4 w/o difficulty, no gross deformities, tone WNL  Neurologic:   CNII-XII intact, normal strength, sensation and reflexes    Throughout Psych:  No HI/SI, judgement and insight good, Euthymic mood. Full Affect.

## 2020-10-31 NOTE — Patient Instructions (Signed)

## 2020-11-01 LAB — HEPATITIS C ANTIBODY: Hep C Virus Ab: <0.1 s/co ratio (ref 0.0–0.9)

## 2020-11-03 ENCOUNTER — Other Ambulatory Visit: Payer: Self-pay

## 2020-11-03 ENCOUNTER — Encounter (HOSPITAL_BASED_OUTPATIENT_CLINIC_OR_DEPARTMENT_OTHER): Payer: Self-pay | Admitting: General Surgery

## 2020-11-07 ENCOUNTER — Other Ambulatory Visit (HOSPITAL_COMMUNITY): Payer: 59

## 2020-11-08 ENCOUNTER — Other Ambulatory Visit (HOSPITAL_COMMUNITY)
Admission: RE | Admit: 2020-11-08 | Discharge: 2020-11-08 | Disposition: A | Discharge status: Home / Self Care | Payer: 59 | Source: Ambulatory Visit | Attending: General Surgery | Admitting: General Surgery

## 2020-11-08 DIAGNOSIS — Z01812 Encounter for preprocedural laboratory examination: Secondary | ICD-10-CM | POA: Insufficient documentation

## 2020-11-08 DIAGNOSIS — Z20822 Contact with and (suspected) exposure to covid-19: Secondary | ICD-10-CM | POA: Insufficient documentation

## 2020-11-08 LAB — SARS CORONAVIRUS 2 (TAT 6-24 HRS): SARS Coronavirus 2: NEGATIVE

## 2020-11-08 MED ORDER — ENSURE PRE-SURGERY PO LIQD: 296.0000 mL | Freq: Once | ORAL | Status: DC

## 2020-11-08 NOTE — Progress Notes (Signed)
      Enhanced Recovery after Surgery for Orthopedics Enhanced Recovery after Surgery is a protocol used to improve the stress on your body and your recovery after surgery.  Patient Instructions  . The night before surgery:  o No food after midnight. ONLY clear liquids after midnight  . The day of surgery (if you do NOT have diabetes):  o Drink ONE (1) Pre-Surgery Clear Ensure as directed.   o This drink was given to you during your hospital  pre-op appointment visit. o The pre-op nurse will instruct you on the time to drink the  Pre-Surgery Ensure depending on your surgery time. o Finish the drink at the designated time by the pre-op nurse.  o Nothing else to drink after completing the  Pre-Surgery Clear Ensure.  . The day of surgery (if you have diabetes): o Drink ONE (1) Gatorade 2 (G2) as directed. o This drink was given to you during your hospital  pre-op appointment visit.  o The pre-op nurse will instruct you on the time to drink the   Gatorade 2 (G2) depending on your surgery time. o Color of the Gatorade may vary. Red is not allowed. o Nothing else to drink after completing the  Gatorade 2 (G2).         If you have questions, please contact your surgeon's office.  Surgical soap given with instructions. Pt verbalized understanding. 

## 2020-11-10 ENCOUNTER — Encounter (HOSPITAL_BASED_OUTPATIENT_CLINIC_OR_DEPARTMENT_OTHER): Payer: Self-pay | Admitting: General Surgery

## 2020-11-10 ENCOUNTER — Encounter (HOSPITAL_BASED_OUTPATIENT_CLINIC_OR_DEPARTMENT_OTHER)
Admission: RE | Disposition: A | Discharge status: Home / Self Care | Payer: Self-pay | Source: Home / Self Care | Attending: General Surgery

## 2020-11-10 ENCOUNTER — Ambulatory Visit (HOSPITAL_BASED_OUTPATIENT_CLINIC_OR_DEPARTMENT_OTHER)
Admission: RE | Admit: 2020-11-10 | Discharge: 2020-11-10 | Disposition: A | Discharge status: Home / Self Care | Payer: 59 | Attending: General Surgery | Admitting: General Surgery

## 2020-11-10 ENCOUNTER — Other Ambulatory Visit: Payer: Self-pay

## 2020-11-10 ENCOUNTER — Ambulatory Visit (HOSPITAL_BASED_OUTPATIENT_CLINIC_OR_DEPARTMENT_OTHER): Payer: 59 | Admitting: Certified Registered"

## 2020-11-10 DIAGNOSIS — F17210 Nicotine dependence, cigarettes, uncomplicated: Secondary | ICD-10-CM | POA: Diagnosis not present

## 2020-11-10 DIAGNOSIS — K8012 Calculus of gallbladder with acute and chronic cholecystitis without obstruction: Secondary | ICD-10-CM | POA: Insufficient documentation

## 2020-11-10 HISTORY — PX: CHOLECYSTECTOMY: SHX55

## 2020-11-10 SURGERY — LAPAROSCOPIC CHOLECYSTECTOMY
Anesthesia: General | Site: Abdomen

## 2020-11-10 MED ORDER — ONDANSETRON HCL 4 MG/2ML IJ SOLN
INTRAMUSCULAR | Status: AC
  Filled 2020-11-10: qty 2

## 2020-11-10 MED ORDER — KETOROLAC TROMETHAMINE 15 MG/ML IJ SOLN
15.0000 mg | INTRAMUSCULAR | Status: AC
  Administered 2020-11-10: 15 mg via INTRAVENOUS

## 2020-11-10 MED ORDER — HYDROMORPHONE HCL 1 MG/ML IJ SOLN
INTRAMUSCULAR | Status: AC
  Filled 2020-11-10: qty 0.5

## 2020-11-10 MED ORDER — CEFAZOLIN SODIUM-DEXTROSE 2-4 GM/100ML-% IV SOLN
2.0000 g | INTRAVENOUS | Status: AC
  Administered 2020-11-10: 2 g via INTRAVENOUS

## 2020-11-10 MED ORDER — DEXAMETHASONE SODIUM PHOSPHATE 4 MG/ML IJ SOLN
INTRAMUSCULAR | Status: DC | PRN
  Administered 2020-11-10: 10 mg via INTRAVENOUS

## 2020-11-10 MED ORDER — OXYCODONE HCL 5 MG PO TABS: 5.0000 mg | ORAL_TABLET | Freq: Four times a day (QID) | ORAL | 0 refills | Status: DC | PRN

## 2020-11-10 MED ORDER — ONDANSETRON 8 MG PO TBDP: 8.0000 mg | ORAL_TABLET | Freq: Three times a day (TID) | ORAL | 0 refills | Status: DC | PRN

## 2020-11-10 MED ORDER — PROPOFOL 10 MG/ML IV BOLUS
INTRAVENOUS | Status: AC
  Filled 2020-11-10: qty 40

## 2020-11-10 MED ORDER — MEPERIDINE HCL 25 MG/ML IJ SOLN: 6.2500 mg | INTRAMUSCULAR | Status: DC | PRN

## 2020-11-10 MED ORDER — PHENYLEPHRINE HCL (PRESSORS) 10 MG/ML IV SOLN
INTRAVENOUS | Status: DC | PRN
  Administered 2020-11-10: 80 ug via INTRAVENOUS

## 2020-11-10 MED ORDER — KETOROLAC TROMETHAMINE 30 MG/ML IJ SOLN
INTRAMUSCULAR | Status: AC
  Filled 2020-11-10: qty 1

## 2020-11-10 MED ORDER — OXYCODONE HCL 5 MG/5ML PO SOLN: 5.0000 mg | Freq: Once | ORAL | Status: AC | PRN

## 2020-11-10 MED ORDER — ONDANSETRON HCL 4 MG/2ML IJ SOLN
INTRAMUSCULAR | Status: DC | PRN
  Administered 2020-11-10: 4 mg via INTRAVENOUS

## 2020-11-10 MED ORDER — SUGAMMADEX SODIUM 500 MG/5ML IV SOLN
INTRAVENOUS | Status: AC
  Filled 2020-11-10: qty 5

## 2020-11-10 MED ORDER — HYDROMORPHONE HCL 1 MG/ML IJ SOLN
0.2500 mg | INTRAMUSCULAR | Status: DC | PRN
  Administered 2020-11-10 (×2): 0.5 mg via INTRAVENOUS

## 2020-11-10 MED ORDER — KETOROLAC TROMETHAMINE 30 MG/ML IJ SOLN
30.0000 mg | Freq: Once | INTRAMUSCULAR | Status: AC | PRN
  Administered 2020-11-10: 15 mg via INTRAVENOUS

## 2020-11-10 MED ORDER — ROCURONIUM BROMIDE 100 MG/10ML IV SOLN
INTRAVENOUS | Status: DC | PRN
  Administered 2020-11-10: 10 mg via INTRAVENOUS
  Administered 2020-11-10: 80 mg via INTRAVENOUS

## 2020-11-10 MED ORDER — PROMETHAZINE HCL 25 MG/ML IJ SOLN
6.2500 mg | INTRAMUSCULAR | Status: DC | PRN
Start: 2020-11-10 — End: 2020-11-10

## 2020-11-10 MED ORDER — SODIUM CHLORIDE 0.9 % IR SOLN
Status: DC | PRN
  Administered 2020-11-10: 1800 mL

## 2020-11-10 MED ORDER — DEXAMETHASONE SODIUM PHOSPHATE 10 MG/ML IJ SOLN
INTRAMUSCULAR | Status: AC
  Filled 2020-11-10: qty 1

## 2020-11-10 MED ORDER — KETOROLAC TROMETHAMINE 15 MG/ML IJ SOLN
INTRAMUSCULAR | Status: AC
  Filled 2020-11-10: qty 1

## 2020-11-10 MED ORDER — BUPIVACAINE HCL (PF) 0.25 % IJ SOLN
INTRAMUSCULAR | Status: DC | PRN
  Administered 2020-11-10: 10 mL

## 2020-11-10 MED ORDER — FENTANYL CITRATE (PF) 100 MCG/2ML IJ SOLN
INTRAMUSCULAR | Status: AC
  Filled 2020-11-10: qty 2

## 2020-11-10 MED ORDER — OXYCODONE HCL 5 MG PO TABS
ORAL_TABLET | ORAL | Status: AC
  Filled 2020-11-10: qty 1

## 2020-11-10 MED ORDER — LACTATED RINGERS IV SOLN: INTRAVENOUS | Status: DC

## 2020-11-10 MED ORDER — CEFAZOLIN SODIUM-DEXTROSE 2-4 GM/100ML-% IV SOLN
INTRAVENOUS | Status: AC
  Filled 2020-11-10: qty 100

## 2020-11-10 MED ORDER — LIDOCAINE 2% (20 MG/ML) 5 ML SYRINGE
INTRAMUSCULAR | Status: AC
  Filled 2020-11-10: qty 5

## 2020-11-10 MED ORDER — MIDAZOLAM HCL 2 MG/2ML IJ SOLN
INTRAMUSCULAR | Status: AC
  Filled 2020-11-10: qty 2

## 2020-11-10 MED ORDER — OXYCODONE HCL 5 MG PO TABS
5.0000 mg | ORAL_TABLET | Freq: Once | ORAL | Status: AC | PRN
Start: 2020-11-10 — End: 2020-11-10
  Administered 2020-11-10: 5 mg via ORAL

## 2020-11-10 MED ORDER — MIDAZOLAM HCL 5 MG/5ML IJ SOLN
INTRAMUSCULAR | Status: DC | PRN
  Administered 2020-11-10: 2 mg via INTRAVENOUS

## 2020-11-10 MED ORDER — SUGAMMADEX SODIUM 500 MG/5ML IV SOLN
INTRAVENOUS | Status: DC | PRN
  Administered 2020-11-10: 200 mg via INTRAVENOUS

## 2020-11-10 MED ORDER — CEFAZOLIN SODIUM 1 G IJ SOLR
INTRAMUSCULAR | Status: AC
  Filled 2020-11-10: qty 20

## 2020-11-10 MED ORDER — FENTANYL CITRATE (PF) 100 MCG/2ML IJ SOLN
INTRAMUSCULAR | Status: DC | PRN
  Administered 2020-11-10 (×2): 50 ug via INTRAVENOUS
  Administered 2020-11-10: 100 ug via INTRAVENOUS

## 2020-11-10 MED ORDER — ACETAMINOPHEN 500 MG PO TABS
1000.0000 mg | ORAL_TABLET | ORAL | Status: AC
  Administered 2020-11-10: 1000 mg via ORAL

## 2020-11-10 MED ORDER — PROPOFOL 10 MG/ML IV BOLUS
INTRAVENOUS | Status: DC | PRN
  Administered 2020-11-10: 150 mg via INTRAVENOUS

## 2020-11-10 MED ORDER — ACETAMINOPHEN 500 MG PO TABS
ORAL_TABLET | ORAL | Status: AC
  Filled 2020-11-10: qty 2

## 2020-11-10 MED ORDER — LIDOCAINE HCL (CARDIAC) PF 100 MG/5ML IV SOSY
PREFILLED_SYRINGE | INTRAVENOUS | Status: DC | PRN
  Administered 2020-11-10: 60 mg via INTRAVENOUS

## 2020-11-10 SURGICAL SUPPLY — 45 items
ADH SKN CLS APL DERMABOND .7 (GAUZE/BANDAGES/DRESSINGS) ×1
APL PRP STRL LF DISP 70% ISPRP (MISCELLANEOUS) ×1
APPLIER CLIP 5 13 M/L LIGAMAX5 (MISCELLANEOUS) ×4
APR CLP MED LRG 5 ANG JAW (MISCELLANEOUS) ×2
BAG SPEC RTRVL 10 TROC 200 (ENDOMECHANICALS) ×1
BLADE CLIPPER SURG (BLADE) ×2 IMPLANT
CANISTER SUCT 1200ML W/VALVE (MISCELLANEOUS) ×2 IMPLANT
CHLORAPREP W/TINT 26 (MISCELLANEOUS) ×2 IMPLANT
CLIP APPLIE 5 13 M/L LIGAMAX5 (MISCELLANEOUS) ×2 IMPLANT
COVER MAYO STAND STRL (DRAPES) IMPLANT
COVER WAND RF STERILE (DRAPES) IMPLANT
DECANTER SPIKE VIAL GLASS SM (MISCELLANEOUS) IMPLANT
DERMABOND ADVANCED (GAUZE/BANDAGES/DRESSINGS) ×1
DERMABOND ADVANCED .7 DNX12 (GAUZE/BANDAGES/DRESSINGS) ×1 IMPLANT
DEVICE TROCAR PUNCTURE CLOSURE (ENDOMECHANICALS) IMPLANT
DRAPE C-ARM 42X72 X-RAY (DRAPES) IMPLANT
DRAPE LAPAROSCOPIC ABDOMINAL (DRAPES) ×2 IMPLANT
ELECT REM PT RETURN 9FT ADLT (ELECTROSURGICAL) ×2
ELECTRODE REM PT RTRN 9FT ADLT (ELECTROSURGICAL) ×1 IMPLANT
GLOVE SURG ENC MOIS LTX SZ7 (GLOVE) ×2 IMPLANT
GLOVE SURG SYN 7.0 (GLOVE) ×2 IMPLANT
GLOVE SURG UNDER POLY LF SZ7 (GLOVE) ×4 IMPLANT
GLOVE SURG UNDER POLY LF SZ7.5 (GLOVE) ×2 IMPLANT
GOWN STRL REUS W/ TWL LRG LVL3 (GOWN DISPOSABLE) ×4 IMPLANT
GOWN STRL REUS W/TWL LRG LVL3 (GOWN DISPOSABLE) ×8
GRASPER SUT TROCAR 14GX15 (MISCELLANEOUS) ×2 IMPLANT
HEMOSTAT SNOW SURGICEL 2X4 (HEMOSTASIS) ×2 IMPLANT
NS IRRIG 1000ML POUR BTL (IV SOLUTION) IMPLANT
PACK BASIN DAY SURGERY FS (CUSTOM PROCEDURE TRAY) ×2 IMPLANT
POUCH RETRIEVAL ECOSAC 10 (ENDOMECHANICALS) ×1 IMPLANT
POUCH RETRIEVAL ECOSAC 10MM (ENDOMECHANICALS) ×1
SCISSORS LAP 5X35 DISP (ENDOMECHANICALS) ×2 IMPLANT
SET CHOLANGIOGRAPH 5 50 .035 (SET/KITS/TRAYS/PACK) IMPLANT
SET IRRIG TUBING LAPAROSCOPIC (IRRIGATION / IRRIGATOR) ×4 IMPLANT
SET TUBE SMOKE EVAC HIGH FLOW (TUBING) ×2 IMPLANT
SLEEVE ENDOPATH XCEL 5M (ENDOMECHANICALS) ×4 IMPLANT
SLEEVE SCD COMPRESS KNEE MED (STOCKING) ×2 IMPLANT
STRIP CLOSURE SKIN 1/2X4 (GAUZE/BANDAGES/DRESSINGS) ×2 IMPLANT
SUT MNCRL AB 4-0 PS2 18 (SUTURE) ×2 IMPLANT
SUT VICRYL 0 UR6 27IN ABS (SUTURE) ×2 IMPLANT
TOWEL GREEN STERILE FF (TOWEL DISPOSABLE) ×4 IMPLANT
TRAY LAPAROSCOPIC (CUSTOM PROCEDURE TRAY) ×2 IMPLANT
TROCAR XCEL BLUNT TIP 100MML (ENDOMECHANICALS) ×2 IMPLANT
TROCAR XCEL NON-BLD 5MMX100MML (ENDOMECHANICALS) ×2 IMPLANT
TUBE CONNECTING 20X1/4 (TUBING) ×2 IMPLANT

## 2020-11-10 NOTE — Transfer of Care (Signed)
Immediate Anesthesia Transfer of Care Note  Patient: Seth Rios  Procedure(s) Performed: LAPAROSCOPIC CHOLECYSTECTOMY (N/A Abdomen)  Patient Location: PACU  Anesthesia Type:General  Level of Consciousness: drowsy  Airway & Oxygen Therapy: Patient Spontanous Breathing and Patient connected to face mask oxygen  Post-op Assessment: Report given to RN and Post -op Vital signs reviewed and stable  Post vital signs: Reviewed and stable  Last Vitals:  Vitals Value Taken Time  BP 145/85 11/10/20 1224  Temp    Pulse 101 11/10/20 1225  Resp 24 11/10/20 1225  SpO2 100 % 11/10/20 1225  Vitals shown include unvalidated device data.  Last Pain:  Vitals:   11/10/20 0845  TempSrc: Oral  PainSc: 0-No pain         Complications: No complications documented.

## 2020-11-10 NOTE — Op Note (Addendum)
Preoperative diagnosis: Symptomatic cholelithiasis Postoperative diagnosis: Acute on chronic cholecystitis Procedure: Laparoscopic cholecystectomy Surgeon: Dr. Harden Mo Anesthesia: General Estimated blood loss: 50 cc Specimens: Gallbladder and contents to pathology Complications: None Drains: None Special count was correct completion Disposition recovery stable condition  Indications:39 yom who has two year history of diarrhea, urgency. he has one episode of diffuse abdominal pain in 2020 that resolved on own but no other ab pain. eats fine. no n/v. he has seen Dr Russella Dar before and had fairly nondiagnostic workup with note stating would return for further testing if needed. He had trauma ct in 9/21 that shows distended gb and stones (associated with liver lac at the time) and a ct chest from Dr Tonia Brooms recently that shows gallstones.he continues to have some pain and stones on an [place]. We discussed proceeding with lap chole.  Prcodure: After informed consent obtained he was taken to the operating room.  He was given antibiotics.  SCDs were in place.  He was placed under general anesthesia without complication.  He was prepped and draped in the standard sterile surgical fashion.  A surgical timeout was then performed.  I infiltrated Marcaine below his umbilicus.  I made a vertical incision.  I carried this to his fascia.  I grasped his fascia and incised this sharply.  The peritoneum was entered bluntly.  I placed a 0 Vicryl pursestring suture through the fascia.  I then inserted a Hassan trocar and insufflated the abdomen to 15 mmHg pressure.  The we had no prior surgery he has a prior liver laceration from trauma and there were a fair amount of omental adhesions to his abdominal wall on the right side.  I put a left mid abdominal 5 mm trocar and and took down these adhesions with a combination of blunt dissection and sharp dissection.  Once I had done this I then put in the 3 other trochars in  the epigastrium and right side of the abdomen under direct vision without complication.  I was unable to visualize his gallbladder.  It was underneath the omentum.  It took me some time but I was able to peel the omentum off and note a very large distended gallbladder.  Once I confirmed this was the gallbladder I then aspirated it so I was able to grasp it.  I retracted it cephalad.  The colon, duodenum, and stomach were all adherent to the gallbladder.  I took these down with a combination of blunt and sharp dissection with care and no injury was noted.  Once I had this down I attempted to dissect the triangle.  This was very difficult due to the inflammation.  Eventually I was able to visualize the structures in the triangle.  He had a very short cystic duct however.  I elected at this point to take him dome down and I used cautery to release this from the liver.  Once I had done this it became more clear where my cystic duct and cystic artery were.  I was able to get rid of all of the inflammatory tissue.  Once I had this dissected down to just the cystic duct and the cystic artery I then clipped the cystic duct 3 times and divided it leaving 2 clips in place.  The clips traversed the duct and the duct was viable.  I treated the artery in a similar fashion.  The gallbladder did have purulence in it when I aspirated before.  I placed in a retrieval bag and  with some difficulty I removed it from the umbilical incision.  I did have to enlarge my defect in the fascia to get it out.  I then irrigated and obtained hemostasis.  I then removed my umbilical trocar and tied my pursestring down.  I placed 2 additional 0 Vicryl sutures to completely obliterate this defect that is I had made it larger.  I then remove the remaining trochars and desufflated the abdomen.  These were all closed with 4-0 Monocryl and glue.  He tolerated this well was extubated and transferred to recovery stable.

## 2020-11-10 NOTE — Interval H&P Note (Signed)
History and Physical Interval Note:  11/10/2020 9:22 AM  Seth Rios  has presented today for surgery, with the diagnosis of gallstones.  The various methods of treatment have been discussed with the patient and family. After consideration of risks, benefits and other options for treatment, the patient has consented to  Procedure(s) with comments: LAPAROSCOPIC CHOLECYSTECTOMY (N/A) - 60 MIN as a surgical intervention.  The patient's history has been reviewed, patient examined, no change in status, stable for surgery.  I have reviewed the patient's chart and labs.  Questions were answered to the patient's satisfaction.     [name]

## 2020-11-10 NOTE — Discharge Instructions (Signed)
[facility] LAPAROSCOPIC SURGERY: POST OP INSTRUCTIONS  Always review your discharge instruction sheet given to you by the facility where your surgery was performed. IF YOU HAVE DISABILITY OR FAMILY LEAVE FORMS, YOU MUST BRING THEM TO THE OFFICE FOR PROCESSING.   DO NOT GIVE THEM TO YOUR DOCTOR.  1. A prescription for pain medication may be given to you upon discharge.  Take your pain medication as prescribed, if needed.  If narcotic pain medicine is not needed, then you may take acetaminophen (Tylenol), naprosyn (Alleve), or ibuprofen (Advil) as needed. 2. Take your usually prescribed medications unless otherwise directed. 3. If you need a refill on your pain medication, please contact your pharmacy.  They will contact our office to request authorization. Prescriptions will not be filled after 5pm or on week-ends. 4. You should follow a light diet the first few days after arrival home, such as soup and crackers, etc.  Be sure to include lots of fluids daily. 5. Most patients will experience some swelling and bruising in the area of the incisions.  Ice packs will help.  Swelling and bruising can take several days to resolve.  6. It is common to experience some constipation if taking pain medication after surgery.  Increasing fluid intake and taking a stool softener (such as Colace) will usually help or prevent this problem from occurring.  A mild laxative (Milk of Magnesia or Miralax) should be taken according to package instructions if there are no bowel movements after 48 hours. 7. Unless discharge instructions indicate otherwise, you may remove your bandages 48 hours after surgery, and you may shower at that time.  You may have steri-strips (small skin tapes) in place directly over the incision.  These strips should be left on the skin for 7-10 days.  If your surgeon used skin glue on the incision, you may shower in 24 hours.  The glue will flake  off over the next 2-3 weeks.  Any sutures or staples will be removed at the office during your follow-up visit. 8. ACTIVITIES:  You may resume regular (light) daily activities beginning the next day--such as daily self-care, walking, climbing stairs--gradually increasing activities as tolerated.  You may have sexual intercourse when it is comfortable.  Refrain from any heavy lifting or straining until approved by your doctor. a. You may drive when you are no longer taking prescription pain medication, you can comfortably wear a seatbelt, and you can safely maneuver your car and apply brakes. b. RETURN TO WORK:  __________________________________________________________ 9. You should see your doctor in the office for a follow-up appointment approximately 2-3 weeks after your surgery.  Make sure that you call for this appointment within a day or two after you arrive home to insure a convenient appointment time. 10. OTHER INSTRUCTIONS: __________________________________________________________________________________________________________________________ __________________________________________________________________________________________________________________________ WHEN TO CALL YOUR DOCTOR: 1. Fever over 101.0 2. Inability to urinate 3. Continued bleeding from incision. 4. Increased pain, redness, or drainage from the incision. 5. Increasing abdominal pain  The clinic staff is available to answer your questions during regular business hours.  Please don't hesitate to call and ask to speak to one of the nurses for clinical concerns.  If you have a medical emergency, go to the nearest emergency room or call 911.  A surgeon from [facility] is always on call at the hospital. [address], [address], [place], [place]  [address] ? [address] [address], [place], [place]   [address] [phone] ? [phone] ? [phone]   [phone] Web site: [email or web address]  May take  Tylenol after 3pm, if needed.   May take NSAIDS (Ibuprofen, Motrin), after 7pm, if needed.    Post Anesthesia Home Care Instructions  Activity: Get plenty of rest for the remainder of the day. A responsible individual must stay with you for 24 hours following the procedure.  For the next 24 hours, DO NOT: -Drive a car -Advertising copywriter -Drink alcoholic beverages -Take any medication unless instructed by your physician -Make any legal decisions or sign important papers.  Meals: Start with liquid foods such as gelatin or soup. Progress to regular foods as tolerated. Avoid greasy, spicy, heavy foods. If nausea and/or vomiting occur, drink only clear liquids until the nausea and/or vomiting subsides. Call your physician if vomiting continues.  Special Instructions/Symptoms: Your throat may feel dry or sore from the anesthesia or the breathing tube placed in your throat during surgery. If this causes discomfort, gargle with warm salt water. The discomfort should disappear within 24 hours.  If you had a scopolamine patch placed behind your ear for the management of post- operative nausea and/or vomiting:  1. The medication in the patch is effective for 72 hours, after which it should be removed.  Wrap patch in a tissue and discard in the trash. Wash hands thoroughly with soap and water. 2. You may remove the patch earlier than 72 hours if you experience unpleasant side effects which may include dry mouth, dizziness or visual disturbances. 3. Avoid touching the patch. Wash your hands with soap and water after contact with the patch.

## 2020-11-10 NOTE — Anesthesia Procedure Notes (Signed)
Procedure Name: Intubation Performed by: Ezequiel Kayser, CRNA Pre-anesthesia Checklist: Patient identified, Emergency Drugs available, Suction available and Patient being monitored Patient Re-evaluated:Patient Re-evaluated prior to induction Oxygen Delivery Method: Circle System Utilized Preoxygenation: Pre-oxygenation with 100% oxygen Induction Type: IV induction Ventilation: Mask ventilation without difficulty Laryngoscope Size: Mac and 4 Grade View: Grade I Tube type: Oral Tube size: 7.0 mm Number of attempts: 1 Airway Equipment and Method: Stylet and Oral airway Placement Confirmation: ETT inserted through vocal cords under direct vision,  positive ETCO2 and breath sounds checked- equal and bilateral Secured at: 23 cm Tube secured with: Tape Dental Injury: Teeth and Oropharynx as per pre-operative assessment

## 2020-11-10 NOTE — Anesthesia Preprocedure Evaluation (Addendum)
Anesthesia Evaluation  Patient identified by MRN, date of birth, ID band Patient awake    Reviewed: Allergy & Precautions, NPO status , Patient's Chart, lab work & pertinent test results  Airway Mallampati: I  TM Distance: >3 FB Neck ROM: Full    Dental no notable dental hx. (+) Teeth Intact, Dental Advisory Given   Pulmonary asthma (childhood) , Current Smoker and Patient abstained from smoking.,  10 pack year history, 1/2-1ppd x 20 years  No inhalers    Pulmonary exam normal breath sounds clear to auscultation       Cardiovascular negative cardio ROS Normal cardiovascular exam Rhythm:Regular Rate:Normal     Neuro/Psych negative neurological ROS  negative psych ROS   GI/Hepatic Neg liver ROS, gallstones   Endo/Other  negative endocrine ROS  Renal/GU negative Renal ROS  negative genitourinary   Musculoskeletal negative musculoskeletal ROS (+)   Abdominal   Peds  Hematology negative hematology ROS (+) hct 46.1   Anesthesia Other Findings   Reproductive/Obstetrics negative OB ROS                            Anesthesia Physical Anesthesia Plan  ASA: II  Anesthesia Plan: General   Post-op Pain Management:    Induction: Intravenous  PONV Risk Score and Plan: 1 and Treatment may vary due to age or medical condition, Ondansetron, Dexamethasone and Midazolam  Airway Management Planned: Oral ETT  Additional Equipment: None  Intra-op Plan:   Post-operative Plan: Extubation in OR  Informed Consent: I have reviewed the patients History and Physical, chart, labs and discussed the procedure including the risks, benefits and alternatives for the proposed anesthesia with the patient or authorized representative who has indicated his/her understanding and acceptance.     Dental advisory given  Plan Discussed with: CRNA  Anesthesia Plan Comments:        Anesthesia Quick  Evaluation

## 2020-11-10 NOTE — H&P (Signed)
Seth Rios is an 40 y.o. male.   Chief Complaint: gallstones HPI: 48 yom who has two year history of diarrhea, urgency. he has one episode of diffuse abdominal pain in 2020 that resolved on own but no other ab pain. eats fine. no n/v. he has seen Dr Russella Dar before and had fairly nondiagnostic workup with note stating would return for further testing if needed. He had trauma ct in 9/21 that shows distended gb and stones (associated with liver lac at the time) and a ct chest from Dr Tonia Brooms recently that shows gallstones.    Past Medical History:  Diagnosis Date  . Asthma    as a child  . Asthma    childhood asthma  . Liver laceration 04/29/2020    Past Surgical History:  Procedure Laterality Date  . WISDOM TOOTH EXTRACTION      Family History  Problem Relation Age of Onset  . Skin cancer Father   . Diabetes Father   . Skin cancer Brother   . Prostate cancer Paternal Uncle   . Stroke Maternal Grandfather   . Alzheimer's disease Maternal Grandfather   . Parkinson's disease Maternal Grandfather   . Pneumonia Maternal Grandfather   . High Cholesterol Other   . Hypertension Other   . Heart disease Mother   . Diabetes Mother   . Alcohol abuse Paternal Grandfather   . Emphysema Paternal Grandfather        smoker   Social History:  reports that he has been smoking cigarettes. He has a 10.00 pack-year smoking history. He has never used smokeless tobacco. He reports previous alcohol use. He reports previous drug use.  Allergies: No Known Allergies  Medications Prior to Admission  Medication Sig Dispense Refill  . Vitamin D, Ergocalciferol, (DRISDOL) 1.25 MG (50000 UNIT) CAPS capsule Take 1 capsule (50,000 Units total) by mouth every 7 (seven) days. 12 capsule 0  . acetaminophen (TYLENOL) 325 MG tablet Take 2 tablets (650 mg total) by mouth every 6 (six) hours as needed.      Results for orders placed or performed during the hospital encounter of 11/08/20 (from the past  48 hour(s))  SARS CORONAVIRUS 2 (TAT 6-24 HRS) Nasopharyngeal Nasopharyngeal Swab     Status: None   Collection Time: 11/08/20 10:18 AM   Specimen: Nasopharyngeal Swab  Result Value Ref Range   SARS Coronavirus 2 NEGATIVE NEGATIVE    Comment: (NOTE) SARS-CoV-2 target nucleic acids are NOT DETECTED.  The SARS-CoV-2 RNA is generally detectable in upper and lower respiratory specimens during the acute phase of infection. Negative results do not preclude SARS-CoV-2 infection, do not rule out co-infections with other pathogens, and should not be used as the sole basis for treatment or other patient management decisions. Negative results must be combined with clinical observations, patient history, and epidemiological information. The expected result is Negative.  Fact Sheet for Patients: HairSlick.no  Fact Sheet for Healthcare Providers: quierodirigir.com  This test is not yet approved or cleared by the [place] FDA and  has been authorized for detection and/or diagnosis of SARS-CoV-2 by FDA under an Emergency Use Authorization (EUA). This EUA will remain  in effect (meaning this test can be used) for the duration of the COVID-19 declaration under Se ction 564(b)(1) of the Act, 21 U.S.C. section 360bbb-3(b)(1), unless the authorization is terminated or revoked sooner.  Performed at [facility], [address] [place], [place] [address]    No results found.  Review of Systems  All other systems reviewed and are negative.   Blood pressure 120/71, pulse 77, temperature 97.6 F (36.4 C), temperature source Oral, resp. rate 20, height 5\' 9"  (1.753 m), weight 73.1 kg, SpO2 98 %. Physical Exam Constitutional:      Appearance: Normal appearance.  Eyes:     General: No scleral icterus. Abdominal:     General: There is no distension.     Palpations: Abdomen is soft.     Tenderness: There is no abdominal tenderness.   Neurological:     Mental Status: He is alert.      Assessment/Plan Gallstones Laparoscopic cholecystectomy I discussed the procedure in detail.  We discussed the risks and benefits of a laparoscopic cholecystectomy and possible cholangiogram including, but not limited to bleeding, infection, injury to surrounding structures such as the intestine or liver, bile leak, retained gallstones, need to convert to an open procedure, prolonged diarrhea, blood clots such as  DVT, common bile duct injury, anesthesia risks, and possible need for additional procedures.  The likelihood of improvement in symptoms and return to the patient's normal status is good. We discussed the typical post-operative recovery course.   , MD 11/10/2020, 9:20 AM

## 2020-11-11 ENCOUNTER — Encounter (HOSPITAL_BASED_OUTPATIENT_CLINIC_OR_DEPARTMENT_OTHER): Payer: Self-pay | Admitting: General Surgery

## 2020-11-11 LAB — SURGICAL PATHOLOGY

## 2020-11-11 NOTE — Anesthesia Postprocedure Evaluation (Signed)
Anesthesia Post Note  Patient: Seth Rios  Procedure(s) Performed: LAPAROSCOPIC CHOLECYSTECTOMY (N/A Abdomen)     Patient location during evaluation: PACU Anesthesia Type: General Level of consciousness: awake and alert, oriented and patient cooperative Pain management: pain level controlled Vital Signs Assessment: post-procedure vital signs reviewed and stable Respiratory status: spontaneous breathing, nonlabored ventilation and respiratory function stable Cardiovascular status: blood pressure returned to baseline and stable Postop Assessment: no apparent nausea or vomiting Anesthetic complications: no   No complications documented.  Last Vitals:  Vitals:   11/10/20 1315 11/10/20 1347  BP: (!) 135/92 137/86  Pulse: 84 75  Resp: 13 16  Temp:  36.7 C  SpO2: 100% 99%    Last Pain:  Vitals:   11/10/20 0845  TempSrc: Oral                 Lannie Fields

## 2021-06-05 ENCOUNTER — Other Ambulatory Visit: Payer: Self-pay

## 2021-06-05 ENCOUNTER — Ambulatory Visit (INDEPENDENT_AMBULATORY_CARE_PROVIDER_SITE_OTHER)
Admission: RE | Admit: 2021-06-05 | Discharge: 2021-06-05 | Disposition: A | Discharge status: Home / Self Care | Payer: 59 | Source: Ambulatory Visit | Attending: Pulmonary Disease | Admitting: Pulmonary Disease

## 2021-06-05 DIAGNOSIS — R918 Other nonspecific abnormal finding of lung field: Secondary | ICD-10-CM | POA: Diagnosis not present

## 2021-06-16 NOTE — Progress Notes (Signed)
Patient needs a repeat noncontrasted CT scan of the chest in 1 year, please order.  Please have her follow-up in our office to review CT imaging once complete either with myself or APP.  Please ensure patient is placed on callback list.   Thanks,  BLI  Seth Igo, DO [facility] 06/16/2021 10:35 AM

## 2021-06-21 ENCOUNTER — Other Ambulatory Visit: Payer: Self-pay | Admitting: *Deleted

## 2021-06-21 ENCOUNTER — Encounter: Payer: Self-pay | Admitting: Physician Assistant

## 2021-06-21 ENCOUNTER — Other Ambulatory Visit: Payer: Self-pay

## 2021-06-21 ENCOUNTER — Ambulatory Visit (INDEPENDENT_AMBULATORY_CARE_PROVIDER_SITE_OTHER): Payer: 59 | Admitting: Physician Assistant

## 2021-06-21 VITALS — BP 129/86 | HR 83 | Temp 97.9°F | Ht 69.0 in | Wt 163.0 lb

## 2021-06-21 DIAGNOSIS — J019 Acute sinusitis, unspecified: Secondary | ICD-10-CM | POA: Diagnosis not present

## 2021-06-21 DIAGNOSIS — R918 Other nonspecific abnormal finding of lung field: Secondary | ICD-10-CM

## 2021-06-21 DIAGNOSIS — G44019 Episodic cluster headache, not intractable: Secondary | ICD-10-CM | POA: Diagnosis not present

## 2021-06-21 MED ORDER — PREDNISONE 20 MG PO TABS: ORAL_TABLET | ORAL | 0 refills | Status: DC

## 2021-06-21 MED ORDER — AZITHROMYCIN 250 MG PO TABS: ORAL_TABLET | ORAL | 0 refills | Status: AC

## 2021-06-21 MED ORDER — VERAPAMIL HCL 40 MG PO TABS: 40.0000 mg | ORAL_TABLET | Freq: Two times a day (BID) | ORAL | 1 refills | Status: DC

## 2021-06-21 NOTE — Progress Notes (Signed)
Acute Office Visit  Subjective:    Patient ID: Seth Rios, male    DOB: 29-Aug-1980, 40 y.o.   MRN: [id number]  Chief Complaint  Patient presents with   Acute Visit   Headache    HPI Patient is in today for c/o cluster headache. Reports remote hx of cluster headaches x couple of years which he has been managing without medication but they have recently become more significant. States for the past two weeks has been experiencing a headache every night. Headache starts at the left side of his head and radiates to his left eye. Pain is sharp and lasts 20-30 minutes. Has tried Tylenol, Excedrin, muscle relaxer, Advil/Ibuprofen, and Naproxen which did not provide headache relief. Reports has a series of headaches which occur randomly and then will not have them for a while. Also has c/o chest congestion, stuffy nose, mild sore throat, body aches, left earache, postnasal drainage, chest congestion, productive cough with yellow-grey sputum, and sinus pressure. Symptoms started 2 weeks ago. Denies fever, runny nose, vomiting, diarrhea, shortness of breath or sick contact exposures. Has taken Dayquil. States feels better today.   Past Medical History:  Diagnosis Date   Asthma    as a child   Asthma    childhood asthma   Liver laceration 04/29/2020    Past Surgical History:  Procedure Laterality Date   CHOLECYSTECTOMY N/A 11/10/2020   Procedure: LAPAROSCOPIC CHOLECYSTECTOMY;  Surgeon: Rolm Bookbinder, MD;  Location: [facility];  Service: General;  Laterality: N/A;  42 MIN   WISDOM TOOTH EXTRACTION      Family History  Problem Relation Age of Onset   Skin cancer Father    Diabetes Father    Skin cancer Brother    Prostate cancer Paternal Uncle    Stroke Maternal Grandfather    Alzheimer's disease Maternal Grandfather    Parkinson's disease Maternal Grandfather    Pneumonia Maternal Grandfather    High Cholesterol Other    Hypertension Other    Heart disease  Mother    Diabetes Mother    Alcohol abuse Paternal Grandfather    Emphysema Paternal Grandfather        smoker    Social History   Socioeconomic History   Marital status: Married    Spouse name: Not on file   Number of children: 0   Years of education: Not on file   Highest education level: Not on file  Occupational History   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  Tobacco Use   Smoking status: Every Day    Packs/day: 0.50    Years: 20.00    Pack years: 10.00    Types: Cigarettes   Smokeless tobacco: Never  Vaping Use   Vaping Use: Never used  Substance and Sexual Activity   Alcohol use: Not Currently    Comment: rare   Drug use: Not Currently   Sexual activity: Not Currently  Other Topics Concern   Not on file  Social History Narrative   ** Merged History Encounter **       ** Merged History Encounter **       Social Determinants of Health   Financial Resource Strain: Not on file  Food Insecurity: Not on file  Transportation Needs: Not on file  Physical Activity: Not on file  Stress: Not on file  Social Connections: Not on file  Intimate Partner Violence: Not on file    Outpatient Medications Prior to Visit  Medication Sig Dispense Refill  acetaminophen (TYLENOL) 325 MG tablet Take 2 tablets (650 mg total) by mouth every 6 (six) hours as needed.     ondansetron (ZOFRAN ODT) 8 MG disintegrating tablet Take 1 tablet (8 mg total) by mouth every 8 (eight) hours as needed for nausea or vomiting. 20 tablet 0   oxyCODONE (OXY IR/ROXICODONE) 5 MG immediate release tablet Take 1 tablet (5 mg total) by mouth every 6 (six) hours as needed. 10 tablet 0   Vitamin D, Ergocalciferol, (DRISDOL) 1.25 MG (50000 UNIT) CAPS capsule Take 1 capsule (50,000 Units total) by mouth every 7 (seven) days. 12 capsule 0   Facility-Administered Medications Prior to Visit  Medication Dose Route Frequency Provider Last Rate Last Admin   indocyanine green (IC-GREEN) injection 2.5 mg  2.5 mg  Intravenous Once Rolm Bookbinder, MD        No Known Allergies  Review of Systems Review of Systems:  A fourteen system review of systems was performed and found to be positive as per HPI.    Objective:    Physical Exam General:  Well Developed, well nourished, appropriate for stated age.  Neuro:  Alert and oriented,  extra-ocular muscles intact  HEENT:  Normocephalic, atraumatic, no frontal sinus tenderness, slight tenderness of right maxillary, PERRL, erythematous non-bulging TM of left ear, normal TM of right ear, boggy turbinate of left nostril, mild erythema of posterior oropharynx, neck supple, +cervical adenopathy (right submandibular) Skin:  no gross rash, warm, pink. Cardiac:  RRR Respiratory:  CTA B/L,  Not using accessory muscles, speaking in full sentences- unlabored. Vascular:  Ext warm, no cyanosis apprec.; cap RF less 2 sec. Psych:  No HI/SI, judgement and insight good, Euthymic mood. Full Affect.  BP 129/86   Pulse 83   Temp 97.9 F (36.6 C)   Ht 5' 9" (1.753 m)   Wt 163 lb (73.9 kg)   SpO2 97%   BMI 24.07 kg/m  Wt Readings from Last 3 Encounters:  06/21/21 163 lb (73.9 kg)  11/10/20 161 lb 2.5 oz (73.1 kg)  10/31/20 169 lb 6.4 oz (76.8 kg)    Health Maintenance Due  Topic Date Due   Pneumococcal Vaccine 21-44 Years old (1 - PCV) Never done   COVID-19 Vaccine (3 - Booster for Moderna series) 01/27/2020   INFLUENZA VACCINE  Never done    There are no preventive care reminders to display for this patient.   Lab Results  Component Value Date   TSH 1.710 10/27/2020   Lab Results  Component Value Date   WBC 7.5 10/27/2020   HGB 16.6 10/27/2020   HCT 46.1 10/27/2020   MCV 94 10/27/2020   PLT 210 10/27/2020   Lab Results  Component Value Date   NA 142 10/27/2020   K 4.8 10/27/2020   CO2 22 10/27/2020   GLUCOSE 92 10/27/2020   BUN 18 10/27/2020   CREATININE 0.97 10/27/2020   BILITOT 0.5 10/27/2020   ALKPHOS 124 (H) 10/27/2020   AST 19  10/27/2020   ALT 20 10/27/2020   PROT 6.8 10/27/2020   ALBUMIN 4.7 10/27/2020   CALCIUM 9.5 10/27/2020   ANIONGAP 9 05/01/2020   EGFR 101 10/27/2020   Lab Results  Component Value Date   CHOL 215 (H) 10/27/2020   Lab Results  Component Value Date   HDL 39 (L) 10/27/2020   Lab Results  Component Value Date   LDLCALC 148 (H) 10/27/2020   Lab Results  Component Value Date   TRIG 154 (H) 10/27/2020  Lab Results  Component Value Date   CHOLHDL 5.5 (H) 10/27/2020   Lab Results  Component Value Date   HGBA1C 6.0 (H) 10/27/2020       Assessment & Plan:   Problem List Items Addressed This Visit   None Visit Diagnoses     Episodic cluster headache, not intractable    -  Primary   Relevant Medications   predniSONE (DELTASONE) 20 MG tablet   verapamil (CALAN) 40 MG tablet   Acute non-recurrent sinusitis, unspecified location       Relevant Medications   azithromycin (ZITHROMAX) 250 MG tablet   predniSONE (DELTASONE) 20 MG tablet      Discussed with patient management options for cluster headache and prefers to start low dose preventative therapy when needed for episodes. Will start verapamil 40 mg BID and recommend titration as needed for efficacy (recommend 240 mg). Advised to let me know if unable to tolerate medication. Will start corticosteroid therapy for acute attack. Discussed with patient some of his upper respiratory symptoms possibly related to his cluster headache. Patient also has s/sx suggestive of bacterial sinusitis so will start oral antibiotic therapy. Recommend to follow up in 3 months to reassess headache and medication therapy.  Meds ordered this encounter  Medications   azithromycin (ZITHROMAX) 250 MG tablet    Sig: Take 2 tablets on day 1, then 1 tablet daily on days 2 through 5    Dispense:  6 tablet    Refill:  0    Order Specific Question:   Supervising Provider    Answer:   Beatrice Lecher D [[id number]]   predniSONE (DELTASONE) 20 MG tablet     Sig: Take 3 tablets PO x 2 days, then 2 tablets x 2 days, then 1 tablet x 2 days, then 0.5 tablet x 2 days    Dispense:  15 tablet    Refill:  0    Order Specific Question:   Supervising Provider    Answer:   Beatrice Lecher D [[id number]]   verapamil (CALAN) 40 MG tablet    Sig: Take 1 tablet (40 mg total) by mouth 2 (two) times daily.    Dispense:  60 tablet    Refill:  1    Order Specific Question:   Supervising Provider    Answer:   Beatrice Lecher D [[id number]]      Lorrene Reid, PA-C

## 2021-06-21 NOTE — Patient Instructions (Signed)
Cluster Headache Cluster headaches hurt a lot. They normally happen on one side of your head, but they may switch sides. Often, cluster headaches: Cause a lot of pain. Happen for weeks to months. Last from 15 minutes to 3 hours. Happen at the same time each day. Happen at night. Happen many times a day. Happen more often in the fall and springtime. What are the causes? The exact cause is not known. They are not usually caused by foods, changes in body chemicals (hormonalchanges), or stress. What increases the risk? Being a male between the ages of 20-50 years old. Smoking or using products that contain nicotine or tobacco. Having elevated levels of body chemical called histamine. This can happen in people who have allergies. Taking certain medicines that cause blood vessels to expand. Having a parent or brother or sister who has cluster headaches. What are the signs or symptoms? Very bad pain on one side of the head that begins behind or around your eye but may spread to your face, head, and neck. Feeling like you may vomit (nauseous). Being sensitive to light. Runny nose and stuffy nose. Swelling of the forehead or face on the affected side. Eye problems. This might include a droopy or swollen eyelid, eye redness, or tearing on the affected side. Feeling restless or upset. Pale skin or a flushed face. How is this treated? Medicines. Oxygen that is breathed in through a mask. Follow these instructions at home: Headache diary Keep a headache diary as told by your doctor. Doing this can help you and your doctor figure out what triggers your headaches. In your headache diary, include information about: The time of day that your headache started and what you were doing when it began. How long your headache lasted. Where your pain started and whether it moved to other areas. The type of pain. Your level of pain. Use a pain scale and rate the pain with a number from 1 (mild) up to 10  (very bad). The treatment that you used, and any change in symptoms after treatment.  Medicines Take over-the-counter and prescription medicines only as told by your health care provider. Ask your doctor if the medicine prescribed to you: Requires you to avoid driving or using machinery. Can cause trouble pooping (constipation). You may need to take these actions to prevent or treat trouble pooping: Drink enough fluid to keep your pee (urine) pale yellow. Take over-the-counter or prescription medicines. Eat foods that are high in fiber. These include beans, whole grains, and fresh fruits and vegetables. Limit foods that are high in fat and processed sugars. These include fried or sweet foods. Lifestyle  Go to bed at the same time each night. Get the same amount of sleep every night. Get 7-9 hours of sleep each night, or the amount recommended by your doctor. Limit or manage stress. Exercise regularly. Exercise for at least 30 minutes, 5 times each week. Eat a healthy diet. Avoid any foods that you know may trigger your headaches. Do not drink alcohol. Do not use any products that contain nicotine or tobacco, such as cigarettes, e-cigarettes, and chewing tobacco. If you need help quitting, ask your doctor.  General instructions Use oxygen as told by your doctor. Keep all follow-up visits as told by your health care provider. This is important. Contact a doctor if: Your headaches: Change. Get worse. Happen more often. Your medicines or oxygen are not helping. Get help right away if: You faint. You get weak or lose feeling (have   numbness) on one side of your body or face. You see two of everything (double vision). You feel you may vomit or you vomit and it does stop after many hours. You have trouble with your balance or with walking. You have trouble talking. You have neck pain or stiffness and you have a fever. Summary Cluster headaches hurt a lot. Keep a headache diary. Do  not drink alcohol. Medicines and oxygen may help you feel better. This information is not intended to replace advice given to you by your health care provider. Make sure you discuss any questions you have with your healthcare provider. Document Revised: 03/25/2020 Document Reviewed: 08/27/2019 Elsevier Patient Education  2022 Elsevier Inc[employer]  

## 2021-09-05 NOTE — Progress Notes (Signed)
error 

## 2021-09-21 ENCOUNTER — Ambulatory Visit: Payer: 59 | Admitting: Physician Assistant

## 2021-09-26 ENCOUNTER — Ambulatory Visit: Payer: 59 | Admitting: Physician Assistant

## 2021-09-26 NOTE — Progress Notes (Unsigned)
° °Established Patient Office Visit ° °Subjective:  °Patient ID: Seth Rios, male    DOB: 01/06/1981  Age: 41 y.o. MRN: [id number] ° °CC: No chief complaint on file. ° ° °HPI °Seth Rios presents for *** ° °Past Medical History:  °Diagnosis Date  ° Asthma   ° as a child  ° Asthma   ° childhood asthma  ° Liver laceration 04/29/2020  ° ° °Past Surgical History:  °Procedure Laterality Date  ° CHOLECYSTECTOMY N/A 11/10/2020  ° Procedure: LAPAROSCOPIC CHOLECYSTECTOMY;  Surgeon: Wakefield, Matthew, MD;  Location: [facility];  Service: General;  Laterality: N/A;  60 MIN  ° WISDOM TOOTH EXTRACTION    ° ° °Family History  °Problem Relation Age of Onset  ° Skin cancer Father   ° Diabetes Father   ° Skin cancer Brother   ° Prostate cancer Paternal Uncle   ° Stroke Maternal Grandfather   ° Alzheimer's disease Maternal Grandfather   ° Parkinson's disease Maternal Grandfather   ° Pneumonia Maternal Grandfather   ° High Cholesterol Other   ° Hypertension Other   ° Heart disease Mother   ° Diabetes Mother   ° Alcohol abuse Paternal Grandfather   ° Emphysema Paternal Grandfather   °     smoker  ° ° °Social History  ° °Socioeconomic History  ° Marital status: Married  °  Spouse name: Not on file  ° Number of children: 0  ° Years of education: Not on file  ° Highest education level: Not on file  °Occupational History  ° 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  °Tobacco Use  ° Smoking status: Every Day  °  Packs/day: 0.50  °  Years: 20.00  °  Pack years: 10.00  °  Types: Cigarettes  ° Smokeless tobacco: Never  °Vaping Use  ° Vaping Use: Never used  °Substance and Sexual Activity  ° Alcohol use: Not Currently  °  Comment: rare  ° Drug use: Not Currently  ° Sexual activity: Not Currently  °Other Topics Concern  ° Not on file  °Social History Narrative  ° ** Merged History Encounter **  °    ° ** Merged History Encounter **  °    ° °Social Determinants of Health  ° °Financial Resource Strain: Not on file   °Food Insecurity: Not on file  °Transportation Needs: Not on file  °Physical Activity: Not on file  °Stress: Not on file  °Social Connections: Not on file  °Intimate Partner Violence: Not on file  ° ° °Outpatient Medications Prior to Visit  °Medication Sig Dispense Refill  ° predniSONE (DELTASONE) 20 MG tablet Take 3 tablets PO x 2 days, then 2 tablets x 2 days, then 1 tablet x 2 days, then 0.5 tablet x 2 days 15 tablet 0  ° verapamil (CALAN) 40 MG tablet Take 1 tablet (40 mg total) by mouth 2 (two) times daily. 60 tablet 1  ° °Facility-Administered Medications Prior to Visit  °Medication Dose Route Frequency Provider Last Rate Last Admin  ° indocyanine green (IC-GREEN) injection 2.5 mg  2.5 mg Intravenous Once Wakefield, Matthew, MD      ° ° °No Known Allergies ° °ROS °Review of Systems ° °  °Objective:  °  °Physical Exam ° °There were no vitals taken for this visit. °Wt Readings from Last 3 Encounters:  °06/21/21 163 lb (73.9 kg)  °11/10/20 161 lb 2.5 oz (73.1 kg)  °10/31/20 169 lb 6.4 oz (76.8 kg)  ° ° ° °Health   Maintenance Due  °Topic Date Due  ° COVID-19 Vaccine (3 - Booster for Moderna series) 01/27/2020  ° INFLUENZA VACCINE  Never done  ° ° °There are no preventive care reminders to display for this patient. ° °Lab Results  °Component Value Date  ° TSH 1.710 10/27/2020  ° °Lab Results  °Component Value Date  ° WBC 7.5 10/27/2020  ° HGB 16.6 10/27/2020  ° HCT 46.1 10/27/2020  ° MCV 94 10/27/2020  ° PLT 210 10/27/2020  ° °Lab Results  °Component Value Date  ° NA 142 10/27/2020  ° K 4.8 10/27/2020  ° CO2 22 10/27/2020  ° GLUCOSE 92 10/27/2020  ° BUN 18 10/27/2020  ° CREATININE 0.97 10/27/2020  ° BILITOT 0.5 10/27/2020  ° ALKPHOS 124 (H) 10/27/2020  ° AST 19 10/27/2020  ° ALT 20 10/27/2020  ° PROT 6.8 10/27/2020  ° ALBUMIN 4.7 10/27/2020  ° CALCIUM 9.5 10/27/2020  ° ANIONGAP 9 05/01/2020  ° EGFR 101 10/27/2020  ° °Lab Results  °Component Value Date  ° CHOL 215 (H) 10/27/2020  ° °Lab Results  °Component Value  Date  ° HDL 39 (L) 10/27/2020  ° °Lab Results  °Component Value Date  ° LDLCALC 148 (H) 10/27/2020  ° °Lab Results  °Component Value Date  ° TRIG 154 (H) 10/27/2020  ° °Lab Results  °Component Value Date  ° CHOLHDL 5.5 (H) 10/27/2020  ° °Lab Results  °Component Value Date  ° HGBA1C 6.0 (H) 10/27/2020  ° ° °  °Assessment & Plan:  ° °Problem List Items Addressed This Visit   °None ° ° °No orders of the defined types were placed in this encounter. ° ° °Follow-up: No follow-ups on file.  ° ° °William a Aquino, CMA °

## 2021-09-27 ENCOUNTER — Ambulatory Visit: Payer: 59 | Admitting: Physician Assistant

## 2021-10-04 ENCOUNTER — Other Ambulatory Visit: Payer: Self-pay

## 2021-10-04 ENCOUNTER — Ambulatory Visit (INDEPENDENT_AMBULATORY_CARE_PROVIDER_SITE_OTHER): Payer: 59 | Admitting: Physician Assistant

## 2021-10-04 ENCOUNTER — Encounter: Payer: Self-pay | Admitting: Physician Assistant

## 2021-10-04 VITALS — Ht 69.0 in | Wt 169.0 lb

## 2021-10-04 DIAGNOSIS — G44019 Episodic cluster headache, not intractable: Secondary | ICD-10-CM | POA: Diagnosis not present

## 2021-10-04 MED ORDER — VERAPAMIL HCL 40 MG PO TABS: 40.0000 mg | ORAL_TABLET | Freq: Two times a day (BID) | ORAL | 1 refills | Status: DC

## 2021-10-04 NOTE — Progress Notes (Signed)
? ?  ? ? ?Telehealth office visit note for Seth Masker, PA-C- at [facility] ?  ?I connected with current patient today by telephone and verified that I am speaking with the correct person   ? Location of the patient: Home ? Location of the provider: Office ?- This visit type was conducted due to national recommendations for restrictions regarding the COVID-19 Pandemic (e.g. social distancing) in an effort to limit this patient's exposure and mitigate transmission in our community.    ?- No physical exam could be performed with this format, beyond that communicated to [place] by the patient/ family members as noted.   ?- Additionally my office staff/ schedulers were to discuss with the patient that there may be a monetary charge related to this service, depending on their medical insurance.  My understanding is that patient understood and consented to proceed.   ?  ?_________________________________________________________________________________ ? ? ?History of Present Illness: ?Patient calls in to follow-up on episodic cluster headache. Patient reports has not had a headache sine his last visit. Takes it did take him a couple of weeks to be consistent with taking Verapamil 40 mg BID. Tolerating medication without issues.  ? ? ?GAD 7 : Generalized Anxiety Score 06/21/2021  ?Nervous, Anxious, on Edge 1  ?Control/stop worrying 0  ?Worry too much - different things 0  ?Trouble relaxing 0  ?Restless 0  ?Easily annoyed or irritable 0  ?Afraid - awful might happen 0  ?Total GAD 7 Score 1  ?Anxiety Difficulty Not difficult at all  ? ? ?Depression screen [facility] 2/9 10/04/2021 06/21/2021 10/31/2020 06/02/2020 04/01/2019  ?Decreased Interest 0 0 0 0 0  ?Down, Depressed, Hopeless 0 0 0 0 0  ?PHQ - 2 Score 0 0 0 0 0  ?Altered sleeping - 0 0 0 -  ?Tired, decreased energy - 0 0 1 -  ?Change in appetite - 0 0 0 -  ?Feeling bad or failure about yourself  - 0 - 0 -  ?Trouble concentrating - 0 0 0 -  ?Moving slowly or  fidgety/restless - 0 0 0 -  ?Suicidal thoughts - 0 0 0 -  ?PHQ-9 Score - 0 0 1 -  ?Difficult doing work/chores - Not difficult at all - Not difficult at all -  ? ? ? ? ?Impression and Recommendations:   ? ? ?1. Episodic cluster headache, not intractable   ? -Improved and stable. Will continue current medication regimen. Will continue to monitor. ? ? ? ?- As part of my medical decision making, I reviewed the following data within the electronic MEDICAL RECORD NUMBER History obtained from pt /family, CMA notes reviewed and incorporated if applicable, Labs reviewed, Radiograph/ tests reviewed if applicable and OV notes from prior OV's with me, as well as any other specialists she/he has seen since seeing me last, were all reviewed and used in my medical decision making process today.   ? ?- Additionally, when appropriate, discussion had with patient regarding our treatment plan, and their biases/concerns about that plan were used in my medical decision making today.   ? ?- The patient agreed with the plan and demonstrated an understanding of the instructions.   No barriers to understanding were identified.  ?   ?- The patient was advised to call back or seek an in-person evaluation if the symptoms worsen or if the condition fails to improve as anticipated. ? ? ?Return in about 4 weeks (around 11/01/2021) for CPE and FBW.  ? ? ?  No orders of the defined types were placed in this encounter. ? ? ?Meds ordered this encounter  ?Medications  ? verapamil (CALAN) 40 MG tablet  ?  Sig: Take 1 tablet (40 mg total) by mouth 2 (two) times daily.  ?  Dispense:  180 tablet  ?  Refill:  1  ?  Order Specific Question:   Supervising Provider  ?  Answer:   Nani Gasser D [[id number]]  ? ? ?Medications Discontinued During This Encounter  ?Medication Reason  ? verapamil (CALAN) 40 MG tablet Reorder  ?  ? ? ? ?Time spent on telephone encounter was 5 minutes. ? ? ? ? ? ?The 21st Century Cures Act was signed into law in 2016 which includes the  topic of electronic health records.  This provides immediate access to information in MyChart.  This includes consultation notes, operative notes, office notes, lab results and pathology reports.  If you have any questions about what you read please let us know at your next visit or call us at the office.  We are right here with you. ? ? ?__________________________________________________________________________________ ? ? ? ? ?Patient Care Team  ?  Relationship Specialty Notifications Start End  ?Seth Masker, PA-C PCP - General Physician Assistant  05/02/20   ?Seth Masker, PA-C    04/29/20   ? Comment: Merged  ? ? ? ?-Vitals obtained; medications/ allergies reconciled;  personal medical, social, Sx etc.histories were updated by CMA, reviewed by me and are reflected in chart ? ? ?Patient Active Problem List  ? Diagnosis Date Noted  ? Liver laceration 04/29/2020  ? Low HDL (under 40) 04/01/2019  ? 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 Marital stress 04/01/2019  ? Low libido 04/01/2019  ? Other fatigue 04/01/2019  ? Vitamin D insufficiency 04/01/2019  ? Glucose intolerance (impaired glucose tolerance) 04/01/2019  ? Tremor of right hand 04/01/2019  ? ? ? ?Current Meds  ?Medication Sig  ? [DISCONTINUED] verapamil (CALAN) 40 MG tablet Take 1 tablet (40 mg total) by mouth 2 (two) times daily.  ? ? ? ?Allergies:  ?No Known Allergies ? ? ?ROS:  ?See above HPI for pertinent positives and negatives ? ? ?Objective:   ?Height 5\' 9"  (1.753 m), weight 169 lb (76.7 kg).  ?(if some vitals are omitted, this means that patient was UNABLE to obtain them. ) ?General: A & O * 3; sounds in no acute distress; in usual state of health.  ?Respiratory: speaking in full sentences, no conversational dyspnea ?Psych: insight appears good, mood- appears full ? ?  ? ?

## 2021-10-04 NOTE — Patient Instructions (Signed)
Cluster Headache A cluster headache is a type of primary headache that causes deep, intense head pain. Cluster headaches can last from 15 minutes to 3 hours. They usually occur on one side of the head or face. They may occur on the other side of the headwhen a new cluster of headaches begins. Cluster headaches occur repeatedly over weeks to months. They may happen several times a day. They often occur at the same time of day, often at night.They may happen more often in the fall and springtime. What are the causes? The cause of this condition is not known. Unlike migraine and tension headache, a cluster headache generally is not associated with triggers, such as foods,hormonal changes, or stress. What increases the risk? The following factors may make you likely to develop this condition: Being a male between the ages of 20-50 years old. Smoking or using products that contain nicotine or tobacco. Having elevated levels of histamine. This can happen in people who have allergies. Taking medicines that cause blood vessels to expand, such as nitroglycerin. Having a parent or sibling who has cluster headaches. What are the signs or symptoms? Symptoms of this condition include: Extremely bad pain on one side of the head that begins behind or around your eye or temple but may radiate to other areas of your face, head, and neck. Nausea. Sensitivity to light. Runny nose and nasal stuffiness. Forehead or facial sweating on the affected side. Droopy or swollen eyelid, eye redness, or tearing on the affected side. Restlessness and agitation. Pale skin (pallor) or flushing on your face. How is this diagnosed? This condition may be diagnosed based on: Your description of the attacks, including your pain, the location and severity of your headaches, and symptoms. Your health care provider may also ask how often your headaches occur and how long they last. A neurological examination to detect physical signs  of a neurological disorder. Your health care provider will test your senses, reflexes, and nerves. The exam is usually normal in people who have cluster headaches. Tests. Your health care provider may order additional tests to see if your headaches are caused by another medical condition. These tests may show that you do not have cluster headaches. Tests may include: MRI. CT scan. Blood tests. How is this treated? This condition may be treated with: Medicines to relieve pain and to prevent repeated attacks. Some people may need a combination of medicines. Oxygen that is breathed in through a mask. This helps to relieve pain of an attack in 15-20 minutes. Follow these instructions at home: Headache diary Keep a headache diary as told by your health care provider. Doing this can help you and your health care provider figure out what triggers your headaches. In your headache diary, include information about: The time of day that your headache started and what you were doing when it began. How long your headache lasted. Where your pain started and whether it moved to other areas. The type of pain, such as burning, stabbing, throbbing, or cramping. Your level of pain. Use a pain scale and rate the pain with a number from 1 (mild) up to 10 (severe). The treatment that you used, and any change in symptoms after treatment.  Medicines Take over-the-counter and prescription medicines only as told by your health care provider. Ask your health care provider if the medicine prescribed to you: Requires you to avoid driving or using heavy machinery. Can cause constipation. You may need to take these actions to prevent or   treat constipation: Drink enough fluid to keep your urine pale yellow. Take over-the-counter or prescription medicines. Eat foods that are high in fiber, such as beans, whole grains, and fresh fruits and vegetables. Limit foods that are high in fat and processed sugars, such as fried or  sweet foods. Lifestyle  Follow a regular sleep schedule. Do not vary the time that you go to bed or the amount that you sleep from day to day. It is important to stay on the same schedule during a cluster period to help prevent headaches. Get 7-9 hours of sleep each night, or the amount recommended by your health care provider. Limit or manage stress. Consider stress relief options such as acupuncture, counseling, biofeedback, and massage. Talk with your health care provider about which methods might be good for you. Exercise regularly. Exercise for at least 30 minutes, 5 times each week. Moderate exercise may be best. Eat a healthy diet and avoid any specific foods that may trigger your headaches. Do not drink alcohol. Drinking alcohol may quickly trigger a severe headache. Do not use any products that contain nicotine or tobacco, such as cigarettes, e-cigarettes, and chewing tobacco. If you need help quitting, ask your health care provider.  General instructions Use oxygen as told by your health care provider. Keep all follow-up visits as told by your health care provider. This is important. Contact a health care provider if: Your headaches change, become more severe, or occur more often. The medicine or oxygen that your health care provider recommended does not help. Get help right away if you: Faint. Have weakness or numbness, especially on one side of your body or face. Have double vision. Have nausea or vomiting that does not go away within several hours. Have trouble talking, walking, or keeping your balance. Have pain or stiffness in your neck and you have a fever. Summary A cluster headache is a type of primary headache that causes deep, intense head pain, usually on one side of the head. Keep a headache diary to help discover what triggers your headaches. Avoiding alcohol and certain medications may help prevent cluster headaches. There are many treatments for cluster headaches  including oxygen, medications to stop a headache, and medications to prevent the headaches. Talk to your doctor about treatment options. This information is not intended to replace advice given to you by your health care provider. Make sure you discuss any questions you have with your healthcare provider. Document Revised: 08/27/2019 Document Reviewed: 08/27/2019 Elsevier Patient Education  2022 [employer]  

## 2021-11-07 ENCOUNTER — Ambulatory Visit (INDEPENDENT_AMBULATORY_CARE_PROVIDER_SITE_OTHER): Payer: 59 | Admitting: Physician Assistant

## 2021-11-07 ENCOUNTER — Encounter: Payer: Self-pay | Admitting: Physician Assistant

## 2021-11-07 VITALS — BP 100/68 | HR 76 | Temp 97.6°F | Ht 69.0 in | Wt 167.0 lb

## 2021-11-07 DIAGNOSIS — Z13228 Encounter for screening for other metabolic disorders: Secondary | ICD-10-CM

## 2021-11-07 DIAGNOSIS — Z1321 Encounter for screening for nutritional disorder: Secondary | ICD-10-CM

## 2021-11-07 DIAGNOSIS — E559 Vitamin D deficiency, unspecified: Secondary | ICD-10-CM

## 2021-11-07 DIAGNOSIS — Z716 Tobacco abuse counseling: Secondary | ICD-10-CM

## 2021-11-07 DIAGNOSIS — E785 Hyperlipidemia, unspecified: Secondary | ICD-10-CM

## 2021-11-07 DIAGNOSIS — Z0000 Encounter for general adult medical examination without abnormal findings: Secondary | ICD-10-CM

## 2021-11-07 DIAGNOSIS — Z1329 Encounter for screening for other suspected endocrine disorder: Secondary | ICD-10-CM

## 2021-11-07 DIAGNOSIS — Z130 Encounter for screening for diseases of the blood and blood-forming organs and certain disorders involving the immune mechanism: Secondary | ICD-10-CM

## 2021-11-07 DIAGNOSIS — R7302 Impaired glucose tolerance (oral): Secondary | ICD-10-CM

## 2021-11-07 NOTE — Progress Notes (Signed)
? ?Complete physical exam ? ? ?Patient: Seth Rios   DOB: 1981-05-10   41 y.o. Male  MRN: [id number] ?Visit Date: 11/07/2021 ? ? ?Chief Complaint  ?Patient presents with  ? Annual Exam  ? ?Subjective  ?  ?Seth Rios is a 41 y.o. male who presents today for a complete physical exam.  ?He reports consuming a general diet. The patient has a physically strenuous job, but has no regular exercise apart from work.  He generally feels fairly well. He does not have additional problems to discuss today.  ? ? ?Past Medical History:  ?Diagnosis Date  ? Asthma   ? as a child  ? Asthma   ? childhood asthma  ? Liver laceration 04/29/2020  ? ?Past Surgical History:  ?Procedure Laterality Date  ? CHOLECYSTECTOMY N/A 11/10/2020  ? Procedure: LAPAROSCOPIC CHOLECYSTECTOMY;  Surgeon: [name], MD;  Location: [facility];  Service: General;  Laterality: N/A;  60 MIN  ? WISDOM TOOTH EXTRACTION    ? ?Social History  ? ?Socioeconomic History  ? Marital status: Married  ?  Spouse name: Not on file  ? Number of children: 0  ? Years of education: Not on file  ? Highest education level: Not on file  ?Occupational History  ? 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  ?Tobacco Use  ? Smoking status: Every Day  ?  Packs/day: 0.50  ?  Years: 20.00  ?  Pack years: 10.00  ?  Types: Cigarettes  ? Smokeless tobacco: Never  ?Vaping Use  ? Vaping Use: Never used  ?Substance and Sexual Activity  ? Alcohol use: Not Currently  ?  Comment: rare  ? Drug use: Not Currently  ? Sexual activity: Not Currently  ?Other Topics Concern  ? Not on file  ?Social History Narrative  ? ** Merged History Encounter **  ?    ? ** Merged History Encounter **  ?    ? ?Social Determinants of Health  ? ?Financial Resource Strain: Not on file  ?Food Insecurity: Not on file  ?Transportation Needs: Not on file  ?Physical Activity: Not on file  ?Stress: Not on file  ?Social Connections: Not on file  ?Intimate Partner Violence: Not on file   ? ? ? ?Medications: ?Outpatient Medications Prior to Visit  ?Medication Sig  ? verapamil (CALAN) 40 MG tablet Take 1 tablet (40 mg total) by mouth 2 (two) times daily.  ? [DISCONTINUED] predniSONE (DELTASONE) 20 MG tablet Take 3 tablets PO x 2 days, then 2 tablets x 2 days, then 1 tablet x 2 days, then 0.5 tablet x 2 days (Patient not taking: Reported on 10/04/2021)  ? ?Facility-Administered Medications Prior to Visit  ?Medication Dose Route Frequency Provider  ? indocyanine green (IC-GREEN) injection 2.5 mg  2.5 mg Intravenous Once [name], MD  ? ? ?Review of Systems ?Review of Systems:  ?A fourteen system review of systems was performed and found to be positive as per HPI. ? ? ? Objective  ?  ?BP 100/68   Pulse 76   Temp 97.6 ?F (36.4 ?C)   Ht 5\' 9"  (1.753 m)   Wt 167 lb (75.8 kg)   SpO2 98%   BMI 24.66 kg/m?  ? ? ?Physical Exam  ? ?General Appearance:    Well developed, well nourished male. Alert, cooperative, in no acute distress, appears stated age  ?Head:    Normocephalic, without obvious abnormality, atraumatic  ?Eyes:    PERRL, conjunctiva/corneas clear, EOM's intact, fundi  ?  benign, both eyes       ?Ears:    Normal TM's and external ear canals, both ears  ?Nose:   Nares normal, septum midline, mucosa normal, no drainage ?  or sinus tenderness  ?Throat:   Lips, mucosa, and tongue normal; teeth and gums normal, mild erythema of posterior oropharynx  ?Neck:   Supple, symmetrical, trachea midline, no adenopathy;     ?  thyroid:  No enlargement/tenderness/nodules; no JVD  ?Back:     Symmetric, no curvature, ROM normal, no CVA tenderness  ?Lungs:     Clear to auscultation bilaterally, respirations unlabored  ?Chest wall:    No tenderness or deformity  ?Heart:    Normal heart rate. Normal rhythm. No murmurs, rubs, or gallops.  S1 and S2 normal  ?Abdomen:     Soft, non-tender, bowel sounds active all four quadrants,  ?  no masses, no organomegaly  ?Genitalia:    deferred  ?Rectal:    deferred   ?Extremities:   All extremities are intact. No cyanosis or edema  ?Pulses:   2+ and symmetric all extremities  ?Skin:   Skin color, texture, turgor normal, no rashes or lesions  ?Lymph nodes:   Cervical and supraclavicular nodes normal  ?Neurologic:   CNII-XII grossly intact.  ? ? ? ?Last depression screening scores ? ?  11/07/2021  ?  8:35 AM 10/04/2021  ?  2:38 PM 06/21/2021  ?  3:13 PM  ?PHQ 2/9 Scores  ?PHQ - 2 Score 0 0 0  ?PHQ- 9 Score 2  0  ? ?Last fall risk screening ? ?  11/07/2021  ?  8:35 AM  ?Fall Risk   ?Falls in the past year? 0  ?Number falls in past yr: 0  ?Injury with Fall? 0  ?Risk for fall due to : No Fall Risks  ?Follow up Falls evaluation completed  ? ? ? ?No results found for any visits on 11/07/21. ? Assessment & Plan  ?  ?Routine Health Maintenance and Physical Exam ? ?Exercise Activities and Dietary recommendations ?-Heart healthy diet low in fat and carbohydrates.  ? ?Immunization History  ?Administered Date(s) Administered  ? Moderna Sars-Covid-2 Vaccination 11/04/2019, 12/02/2019  ? PPD Test 06/06/2020  ? Tdap 10/31/2020  ? ? ?Health Maintenance  ?Topic Date Due  ? COVID-19 Vaccine (3 - Booster for Moderna series) 01/27/2020  ? INFLUENZA VACCINE  03/06/2022  ? TETANUS/TDAP  11/01/2030  ? Hepatitis C Screening  Completed  ? HIV Screening  Completed  ? HPV VACCINES  Aged Out  ? ? ?Discussed health benefits of physical activity, and encouraged him to engage in regular exercise appropriate for his age and condition. ? ?Problem List Items Addressed This Visit   ? ?  ? Endocrine  ? Glucose intolerance (impaired glucose tolerance)  ? Relevant Orders  ? Hemoglobin A1c  ? Comprehensive metabolic panel  ?  ? Other  ? Vitamin D insufficiency  ? Relevant Orders  ? VITAMIN D 25 Hydroxy (Vit-D Deficiency, Fractures)  ? ?Other Visit Diagnoses   ? ? Healthcare maintenance    -  Primary  ? Relevant Orders  ? TSH  ? Lipid panel  ? Hemoglobin A1c  ? Comprehensive metabolic panel  ? CBC with Differential/Platelet   ? Hyperlipidemia, unspecified hyperlipidemia type      ? Relevant Orders  ? Lipid panel  ? Screening for endocrine, nutritional, metabolic and immunity disorder      ? Relevant Orders  ? TSH  ? Lipid  panel  ? Hemoglobin A1c  ? Comprehensive metabolic panel  ? CBC with Differential/Platelet  ? Encounter for tobacco use cessation counseling      ? ?  ? ?Will obtain routine fasting labs. ?Discussed colonoscopy screening guidelines starting at the age of 8 for average risk patients.  ?UTD Tdap. ?Continue current medication regimen. ?The patient was counseled on the dangers of tobacco use, and was advised to quit.  Reviewed strategies to maximize success, including stress management and substitution of other forms of reinforcement. ? ? ?Return in about 6 months (around 05/09/2022) for HA.  ?  ? ? ? ?Mayer Masker, PA-C  ?[facility] ?[phone] (phone) ?[phone] (fax) ? ? [facility] ?

## 2021-11-07 NOTE — Patient Instructions (Signed)

## 2021-11-08 LAB — COMPREHENSIVE METABOLIC PANEL
ALT: 26 IU/L (ref 0–44)
AST: 18 IU/L (ref 0–40)
Albumin/Globulin Ratio: 2.2 (ref 1.2–2.2)
Albumin: 4.6 g/dL (ref 4.0–5.0)
Alkaline Phosphatase: 118 IU/L (ref 44–121)
BUN/Creatinine Ratio: 24 — ABNORMAL HIGH (ref 9–20)
BUN: 21 mg/dL (ref 6–24)
Bilirubin Total: 0.3 mg/dL (ref 0.0–1.2)
CO2: 24 mmol/L (ref 20–29)
Calcium: 9.8 mg/dL (ref 8.7–10.2)
Chloride: 103 mmol/L (ref 96–106)
Creatinine, Ser: 0.88 mg/dL (ref 0.76–1.27)
Globulin, Total: 2.1 g/dL (ref 1.5–4.5)
Glucose: 102 mg/dL — ABNORMAL HIGH (ref 70–99)
Potassium: 5.2 mmol/L (ref 3.5–5.2)
Sodium: 139 mmol/L (ref 134–144)
Total Protein: 6.7 g/dL (ref 6.0–8.5)
eGFR: 110 mL/min/{1.73_m2} (ref >59)

## 2021-11-08 LAB — CBC WITH DIFFERENTIAL/PLATELET
Basophils Absolute: 0 10*3/uL (ref 0.0–0.2)
Basos: 1 %
EOS (ABSOLUTE): 0.1 10*3/uL (ref 0.0–0.4)
Eos: 2 %
Hematocrit: 47.3 % (ref 37.5–51.0)
Hemoglobin: 15.9 g/dL (ref 13.0–17.7)
Immature Grans (Abs): 0 10*3/uL (ref 0.0–0.1)
Immature Granulocytes: 0 %
Lymphocytes Absolute: 1.9 10*3/uL (ref 0.7–3.1)
Lymphs: 23 %
MCH: 32.4 pg (ref 26.6–33.0)
MCHC: 33.6 g/dL (ref 31.5–35.7)
MCV: 97 fL (ref 79–97)
Monocytes Absolute: 0.6 10*3/uL (ref 0.1–0.9)
Monocytes: 7 %
Neutrophils Absolute: 5.8 10*3/uL (ref 1.4–7.0)
Neutrophils: 67 %
Platelets: 226 10*3/uL (ref 150–450)
RBC: 4.9 x10E6/uL (ref 4.14–5.80)
RDW: 11.8 % (ref 11.6–15.4)
WBC: 8.6 10*3/uL (ref 3.4–10.8)

## 2021-11-08 LAB — LIPID PANEL
Chol/HDL Ratio: 5.8 ratio — ABNORMAL HIGH (ref 0.0–5.0)
Cholesterol, Total: 221 mg/dL — ABNORMAL HIGH (ref 100–199)
HDL: 38 mg/dL — ABNORMAL LOW (ref >39)
LDL Chol Calc (NIH): 154 mg/dL — ABNORMAL HIGH (ref 0–99)
Triglycerides: 157 mg/dL — ABNORMAL HIGH (ref 0–149)
VLDL Cholesterol Cal: 29 mg/dL (ref 5–40)

## 2021-11-08 LAB — HEMOGLOBIN A1C
Est. average glucose Bld gHb Est-mCnc: 123 mg/dL
Hgb A1c MFr Bld: 5.9 % — ABNORMAL HIGH (ref 4.8–5.6)

## 2021-11-08 LAB — TSH: TSH: 1.43 u[IU]/mL (ref 0.450–4.500)

## 2021-11-08 LAB — VITAMIN D 25 HYDROXY (VIT D DEFICIENCY, FRACTURES): Vit D, 25-Hydroxy: 27.4 ng/mL — ABNORMAL LOW (ref 30.0–100.0)

## 2022-04-11 ENCOUNTER — Other Ambulatory Visit: Payer: Self-pay | Admitting: Physician Assistant

## 2022-04-11 DIAGNOSIS — G44019 Episodic cluster headache, not intractable: Secondary | ICD-10-CM

## 2022-04-25 ENCOUNTER — Other Ambulatory Visit: Payer: Self-pay | Admitting: Physician Assistant

## 2022-04-25 DIAGNOSIS — G44019 Episodic cluster headache, not intractable: Secondary | ICD-10-CM

## 2022-06-01 ENCOUNTER — Encounter (HOSPITAL_COMMUNITY): Payer: Self-pay

## 2022-06-01 ENCOUNTER — Ambulatory Visit (HOSPITAL_COMMUNITY): Payer: 59

## 2023-08-01 IMAGING — CT CT CHEST W/O CM
2 of 4 series · 15 of 36 positions shown, 18 images · non-contrast
Comparison: August 10, 2020.

CLINICAL DATA: Pulmonary nodules.  Shortness of breath.

EXAM:
CT CHEST WITHOUT CONTRAST
TECHNIQUE: Multidetector CT imaging of the chest was performed following the
standard protocol without IV contrast.

[Series 2: thorax · axial · 0.69mm/px · z∈[-328,-62]mm · 12 of 159 slices shown, 15 images]
[im 13/159  mediastinal]
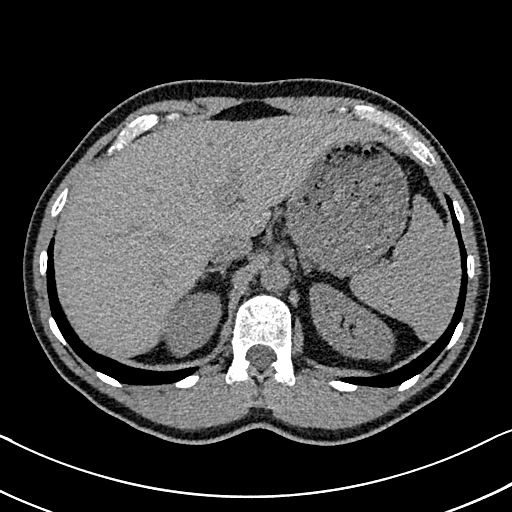
[im 13/159  lung]
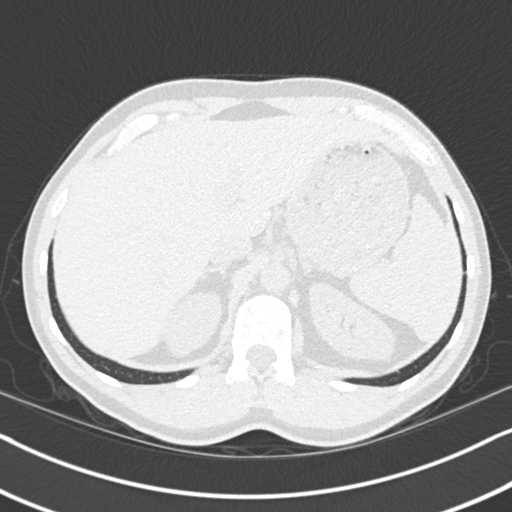
[im 25/159  lung]
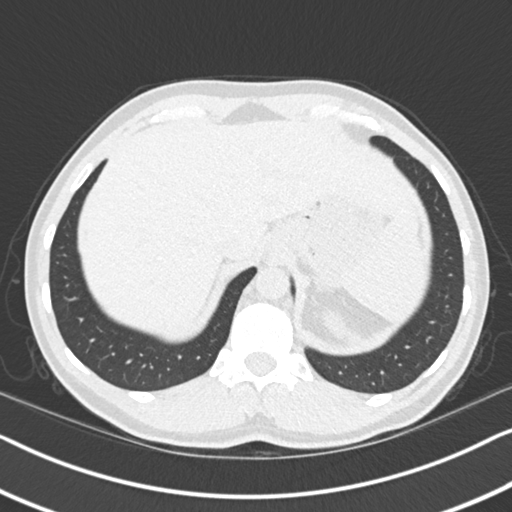
[im 37/159  lung]
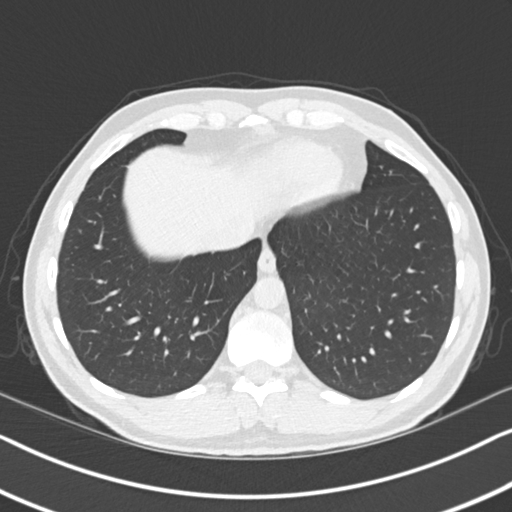
[im 49/159  lung]
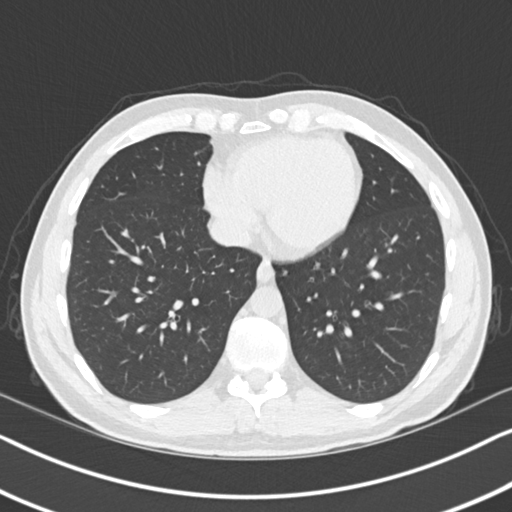
[im 61/159  mediastinal]
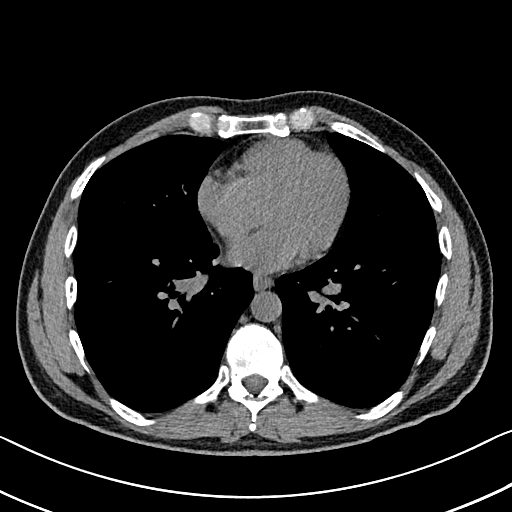
[im 61/159  lung]
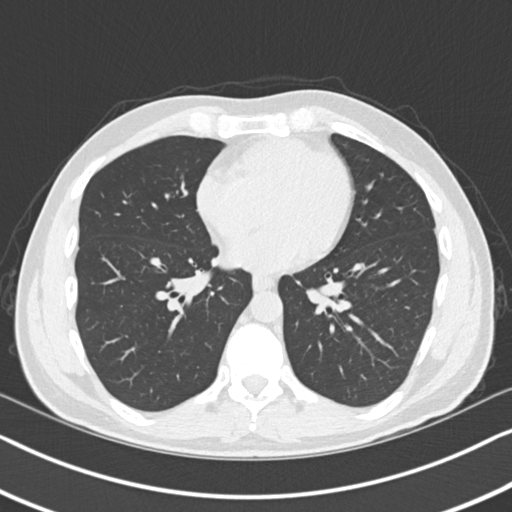
[im 73/159  lung]
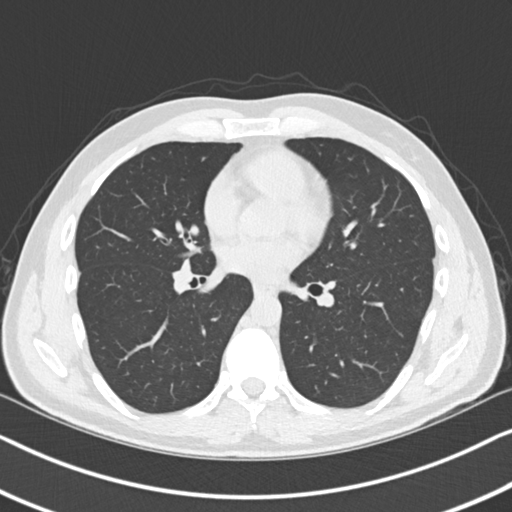
[im 86/159  lung]
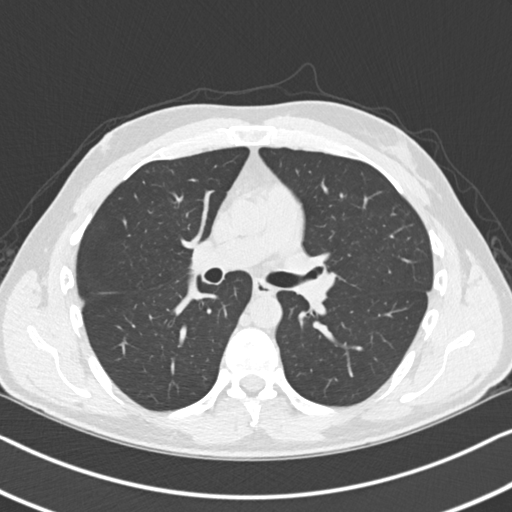
[im 98/159  lung]
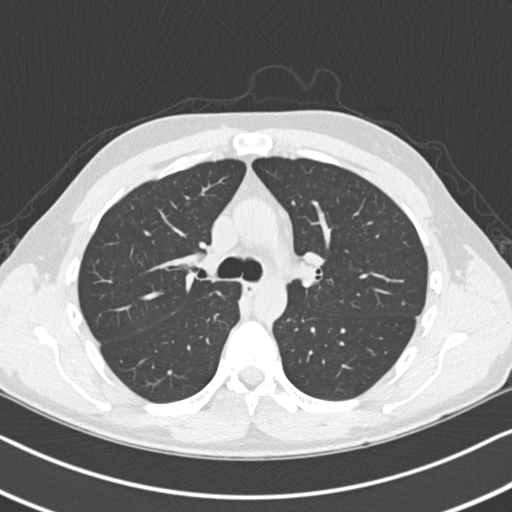
[im 110/159  mediastinal]
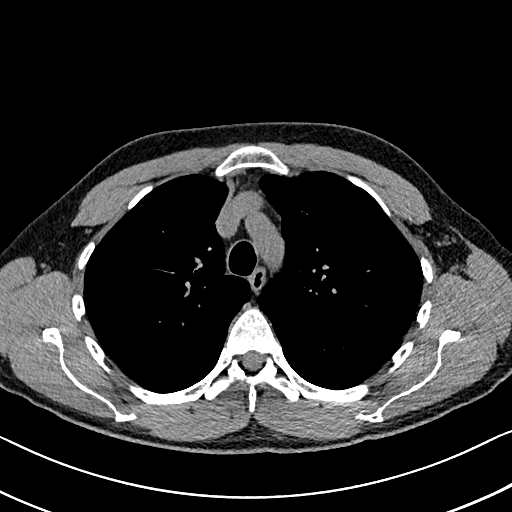
[im 110/159  lung]
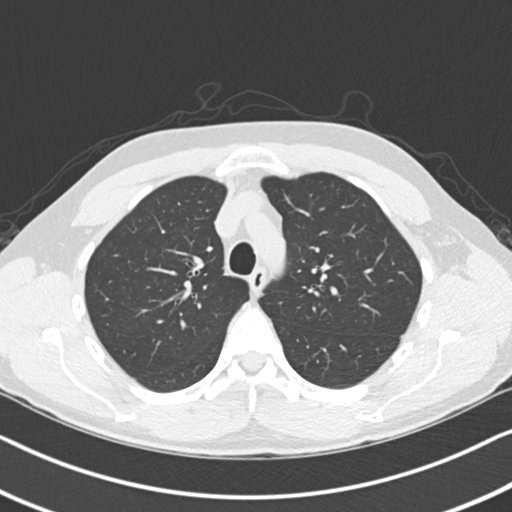
[im 122/159  lung]
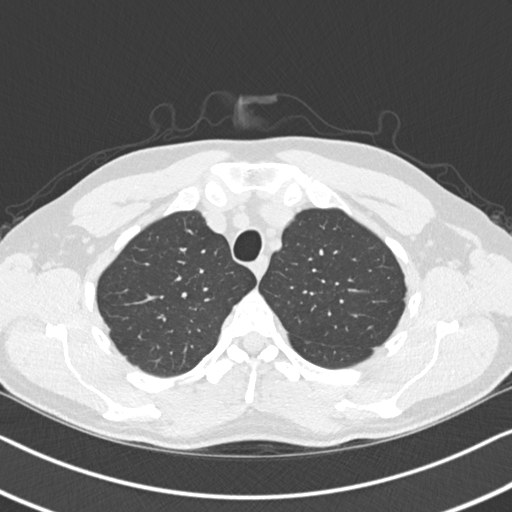
[im 134/159  lung]
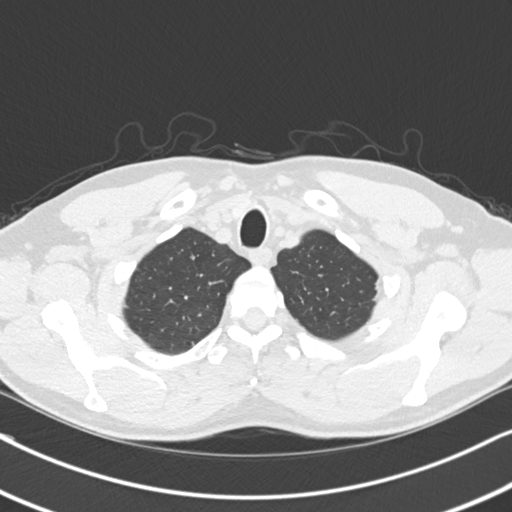
[im 146/159  lung]
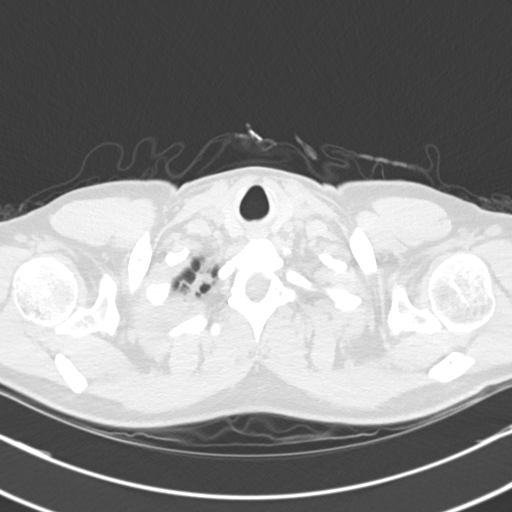

[Series 5: coronal · coronal · 0.62mm/px · 3 of 121 slices shown]
[im 25/121  lung]
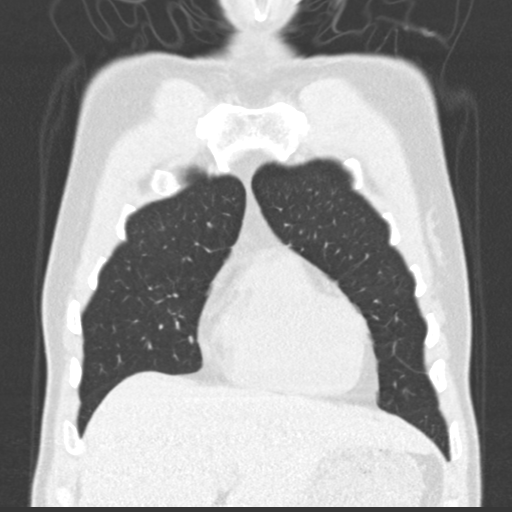
[im 49/121  lung]
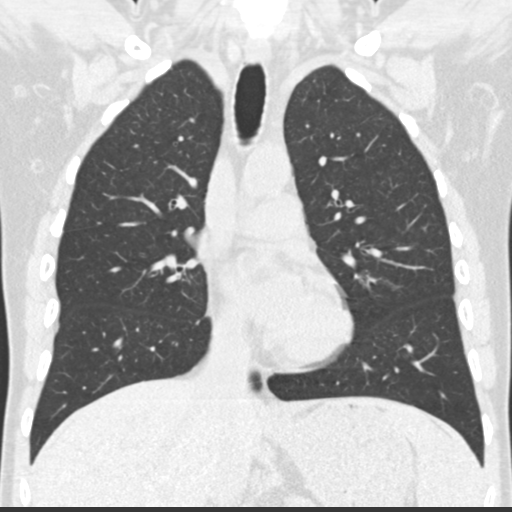
[im 73/121  lung]
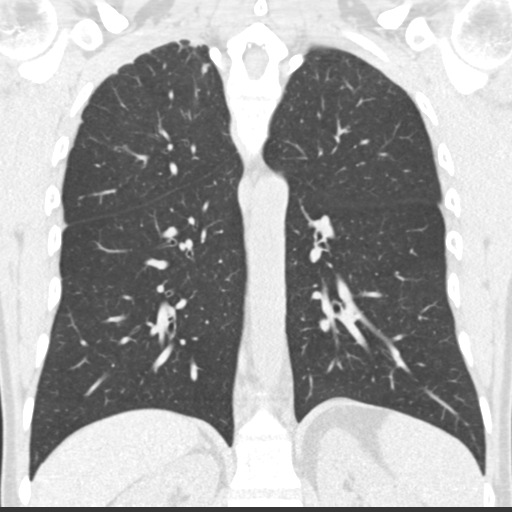

[15 of 36 positions shown; findings below may reference images not displayed]

FINDINGS: Cardiovascular: No significant vascular findings. Normal heart size.
No pericardial effusion.

Mediastinum/Nodes: No enlarged mediastinal or axillary lymph nodes.
Thyroid gland, trachea, and esophagus demonstrate no significant
findings.

Lungs/Pleura: No pneumothorax or pleural effusion is noted. Mild
biapical scarring is noted. Cavitary nodule seen in right upper lobe
adjacent to major fissure has nearly resolved. Stable 5 mm nodule is
noted in right lung apex best seen on image number 73 of series 5.
No consolidative process is noted.

Upper Abdomen: No acute abnormality.

Musculoskeletal: No chest wall mass or suspicious bone lesions
identified.
IMPRESSION: Cavitary 6 mm nodule seen in right upper lobe adjacent to major
fissure on prior exam has nearly resolved. Stable 5 mm nodule is
noted in right lung apex. Follow-up unenhanced chest CT in 12 months
is recommended to ensure stability.
